# Patient Record
Sex: Male | Born: 1970 | Race: White | Hispanic: No | Marital: Married | Smoking: Never smoker
Health system: Southern US, Community
[De-identification: ages and names within clinical notes are randomized; demographics above are authoritative.]

## PROBLEM LIST (undated history)

## (undated) DIAGNOSIS — M549 Dorsalgia, unspecified: Secondary | ICD-10-CM

## (undated) DIAGNOSIS — N189 Chronic kidney disease, unspecified: Secondary | ICD-10-CM

## (undated) DIAGNOSIS — G500 Trigeminal neuralgia: Secondary | ICD-10-CM

## (undated) DIAGNOSIS — N200 Calculus of kidney: Secondary | ICD-10-CM

## (undated) HISTORY — DX: Dorsalgia, unspecified: M54.9

## (undated) HISTORY — DX: Chronic kidney disease, unspecified: N18.9

## (undated) HISTORY — PX: PERCUTANEOUS NEPHROSTOMY: SHX2208

## (undated) HISTORY — PX: OTHER SURGICAL HISTORY: SHX169

---

## 2003-02-16 HISTORY — PX: WISDOM TOOTH EXTRACTION: SHX21

## 2009-09-17 ENCOUNTER — Emergency Department (HOSPITAL_COMMUNITY): Admission: EM | Admit: 2009-09-17 | Discharge: 2009-09-17 | Payer: Self-pay | Admitting: Family Medicine

## 2009-09-17 ENCOUNTER — Emergency Department (HOSPITAL_COMMUNITY): Admission: EM | Admit: 2009-09-17 | Discharge: 2009-09-18 | Payer: Self-pay | Admitting: Emergency Medicine

## 2009-09-25 ENCOUNTER — Ambulatory Visit (HOSPITAL_COMMUNITY): Admission: RE | Admit: 2009-09-25 | Discharge: 2009-09-25 | Payer: Self-pay | Admitting: Urology

## 2009-10-06 ENCOUNTER — Ambulatory Visit (HOSPITAL_COMMUNITY): Admission: RE | Admit: 2009-10-06 | Discharge: 2009-10-06 | Payer: Self-pay | Admitting: Urology

## 2009-10-10 ENCOUNTER — Emergency Department (HOSPITAL_COMMUNITY): Admission: EM | Admit: 2009-10-10 | Discharge: 2009-10-10 | Payer: Self-pay | Admitting: Emergency Medicine

## 2009-10-12 ENCOUNTER — Emergency Department (HOSPITAL_COMMUNITY): Admission: EM | Admit: 2009-10-12 | Discharge: 2009-10-12 | Payer: Self-pay | Admitting: Family Medicine

## 2010-01-28 ENCOUNTER — Inpatient Hospital Stay (HOSPITAL_COMMUNITY)
Admission: RE | Admit: 2010-01-28 | Discharge: 2010-01-30 | Payer: Self-pay | Source: Home / Self Care | Attending: Urology | Admitting: Urology

## 2010-01-28 ENCOUNTER — Encounter: Payer: Self-pay | Admitting: Urology

## 2010-04-27 LAB — CBC
HCT: 38.3 % — ABNORMAL LOW (ref 39.0–52.0)
Hemoglobin: 13.2 g/dL (ref 13.0–17.0)
MCH: 29.7 pg (ref 26.0–34.0)
MCHC: 34.5 g/dL (ref 30.0–36.0)
RDW: 12.8 % (ref 11.5–15.5)

## 2010-04-28 LAB — CBC
HCT: 42 % (ref 39.0–52.0)
Hemoglobin: 14.7 g/dL (ref 13.0–17.0)
MCH: 29.7 pg (ref 26.0–34.0)
MCHC: 35 g/dL (ref 30.0–36.0)
RDW: 13 % (ref 11.5–15.5)

## 2010-04-28 LAB — SURGICAL PCR SCREEN
MRSA, PCR: NEGATIVE
Staphylococcus aureus: NEGATIVE

## 2010-05-01 LAB — DIFFERENTIAL
Basophils Absolute: 0 10*3/uL (ref 0.0–0.1)
Basophils Relative: 0 % (ref 0–1)
Eosinophils Absolute: 0 10*3/uL (ref 0.0–0.7)
Eosinophils Relative: 0 % (ref 0–5)
Lymphocytes Relative: 5 % — ABNORMAL LOW (ref 12–46)
Monocytes Absolute: 0.8 10*3/uL (ref 0.1–1.0)
Monocytes Absolute: 0.9 10*3/uL (ref 0.1–1.0)
Monocytes Relative: 6 % (ref 3–12)
Monocytes Relative: 6 % (ref 3–12)
Neutro Abs: 12.4 10*3/uL — ABNORMAL HIGH (ref 1.7–7.7)
Neutrophils Relative %: 89 % — ABNORMAL HIGH (ref 43–77)

## 2010-05-01 LAB — URINALYSIS, ROUTINE W REFLEX MICROSCOPIC
Glucose, UA: NEGATIVE mg/dL
Protein, ur: 100 mg/dL — AB
Urobilinogen, UA: 0.2 mg/dL (ref 0.0–1.0)

## 2010-05-01 LAB — CBC
HCT: 43.9 % (ref 39.0–52.0)
HCT: 44.1 % (ref 39.0–52.0)
Hemoglobin: 15.3 g/dL (ref 13.0–17.0)
Hemoglobin: 15.3 g/dL (ref 13.0–17.0)
MCH: 29.9 pg (ref 26.0–34.0)
MCHC: 34.7 g/dL (ref 30.0–36.0)
MCV: 86.9 fL (ref 78.0–100.0)
RDW: 13.3 % (ref 11.5–15.5)
WBC: 13.9 10*3/uL — ABNORMAL HIGH (ref 4.0–10.5)

## 2010-05-01 LAB — URINALYSIS, MICROSCOPIC ONLY
Glucose, UA: NEGATIVE mg/dL
Protein, ur: NEGATIVE mg/dL
Specific Gravity, Urine: 1.021 (ref 1.005–1.030)

## 2010-05-01 LAB — POCT URINALYSIS DIPSTICK
Bilirubin Urine: NEGATIVE
Glucose, UA: NEGATIVE mg/dL
Ketones, ur: 15 mg/dL — AB
Nitrite: NEGATIVE
Protein, ur: NEGATIVE mg/dL
Specific Gravity, Urine: 1.02 (ref 1.005–1.030)
Urobilinogen, UA: 0.2 mg/dL (ref 0.0–1.0)
pH: 6 (ref 5.0–8.0)
pH: 5 (ref 5.0–8.0)

## 2010-05-01 LAB — URINE CULTURE
Colony Count: NO GROWTH
Colony Count: NO GROWTH
Culture  Setup Time: 201108281953
Culture: NO GROWTH

## 2010-05-01 LAB — BASIC METABOLIC PANEL
BUN: 15 mg/dL (ref 6–23)
Chloride: 102 mEq/L (ref 96–112)
Glucose, Bld: 118 mg/dL — ABNORMAL HIGH (ref 70–99)
Potassium: 3.6 mEq/L (ref 3.5–5.1)
Sodium: 134 mEq/L — ABNORMAL LOW (ref 135–145)

## 2010-05-01 LAB — POCT I-STAT, CHEM 8
Calcium, Ion: 1.09 mmol/L — ABNORMAL LOW (ref 1.12–1.32)
Creatinine, Ser: 1 mg/dL (ref 0.4–1.5)
Glucose, Bld: 94 mg/dL (ref 70–99)
Hemoglobin: 16.3 g/dL (ref 13.0–17.0)
TCO2: 24 mmol/L (ref 0–100)

## 2010-05-01 LAB — HEMOCCULT GUIAC POC 1CARD (OFFICE): Fecal Occult Bld: NEGATIVE

## 2012-04-01 ENCOUNTER — Other Ambulatory Visit: Payer: Self-pay

## 2012-12-21 ENCOUNTER — Other Ambulatory Visit: Payer: Self-pay

## 2014-01-18 ENCOUNTER — Other Ambulatory Visit (HOSPITAL_COMMUNITY): Payer: Self-pay | Admitting: Family Medicine

## 2014-01-18 DIAGNOSIS — R590 Localized enlarged lymph nodes: Secondary | ICD-10-CM

## 2014-01-23 ENCOUNTER — Ambulatory Visit (HOSPITAL_COMMUNITY)
Admission: RE | Admit: 2014-01-23 | Discharge: 2014-01-23 | Disposition: A | Discharge status: Home / Self Care | Payer: 59 | Source: Ambulatory Visit | Attending: Family Medicine | Admitting: Family Medicine

## 2014-01-23 DIAGNOSIS — R590 Localized enlarged lymph nodes: Secondary | ICD-10-CM | POA: Diagnosis present

## 2014-05-30 ENCOUNTER — Other Ambulatory Visit: Payer: Self-pay | Admitting: Gastroenterology

## 2014-07-14 ENCOUNTER — Emergency Department (HOSPITAL_COMMUNITY)
Admission: EM | Admit: 2014-07-14 | Discharge: 2014-07-14 | Disposition: A | Discharge status: Home / Self Care | Payer: 59 | Attending: Emergency Medicine | Admitting: Emergency Medicine

## 2014-07-14 ENCOUNTER — Encounter (HOSPITAL_COMMUNITY): Payer: Self-pay

## 2014-07-14 ENCOUNTER — Emergency Department (HOSPITAL_COMMUNITY): Payer: 59

## 2014-07-14 DIAGNOSIS — N200 Calculus of kidney: Secondary | ICD-10-CM | POA: Insufficient documentation

## 2014-07-14 DIAGNOSIS — N189 Chronic kidney disease, unspecified: Secondary | ICD-10-CM | POA: Insufficient documentation

## 2014-07-14 DIAGNOSIS — R109 Unspecified abdominal pain: Secondary | ICD-10-CM | POA: Diagnosis present

## 2014-07-14 LAB — I-STAT CHEM 8, ED
BUN: 19 mg/dL (ref 6–20)
CHLORIDE: 107 mmol/L (ref 101–111)
Calcium, Ion: 1.12 mmol/L (ref 1.12–1.23)
Creatinine, Ser: 1.1 mg/dL (ref 0.61–1.24)
Glucose, Bld: 126 mg/dL — ABNORMAL HIGH (ref 65–99)
HCT: 44 % (ref 39.0–52.0)
HEMOGLOBIN: 15 g/dL (ref 13.0–17.0)
POTASSIUM: 4 mmol/L (ref 3.5–5.1)
Sodium: 142 mmol/L (ref 135–145)
TCO2: 20 mmol/L (ref 0–100)

## 2014-07-14 LAB — URINE MICROSCOPIC-ADD ON

## 2014-07-14 LAB — URINALYSIS, ROUTINE W REFLEX MICROSCOPIC
Bilirubin Urine: NEGATIVE
Glucose, UA: NEGATIVE mg/dL
KETONES UR: 15 mg/dL — AB
Leukocytes, UA: NEGATIVE
Nitrite: NEGATIVE
PH: 6.5 (ref 5.0–8.0)
Protein, ur: NEGATIVE mg/dL
SPECIFIC GRAVITY, URINE: 1.024 (ref 1.005–1.030)
UROBILINOGEN UA: 0.2 mg/dL (ref 0.0–1.0)

## 2014-07-14 LAB — CBC
HEMATOCRIT: 40.5 % (ref 39.0–52.0)
Hemoglobin: 13.9 g/dL (ref 13.0–17.0)
MCH: 28.8 pg (ref 26.0–34.0)
MCHC: 34.3 g/dL (ref 30.0–36.0)
MCV: 84 fL (ref 78.0–100.0)
Platelets: 285 10*3/uL (ref 150–400)
RBC: 4.82 MIL/uL (ref 4.22–5.81)
RDW: 12.9 % (ref 11.5–15.5)
WBC: 9 10*3/uL (ref 4.0–10.5)

## 2014-07-14 MED ORDER — ONDANSETRON HCL 4 MG/2ML IJ SOLN
4.0000 mg | Freq: Once | INTRAMUSCULAR | Status: AC
  Administered 2014-07-14: 4 mg via INTRAVENOUS
  Filled 2014-07-14: qty 2

## 2014-07-14 MED ORDER — CIPROFLOXACIN IN D5W 400 MG/200ML IV SOLN
INTRAVENOUS | Status: AC
  Filled 2014-07-14: qty 200

## 2014-07-14 MED ORDER — OXYCODONE-ACETAMINOPHEN 5-325 MG PO TABS: 1.0000 | ORAL_TABLET | Freq: Four times a day (QID) | ORAL | Status: AC | PRN

## 2014-07-14 MED ORDER — CIPROFLOXACIN IN D5W 400 MG/200ML IV SOLN: 400.0000 mg | INTRAVENOUS | Status: DC

## 2014-07-14 MED ORDER — HYDROMORPHONE HCL 1 MG/ML IJ SOLN
1.0000 mg | Freq: Once | INTRAMUSCULAR | Status: AC
  Administered 2014-07-14: 1 mg via INTRAVENOUS
  Filled 2014-07-14: qty 1

## 2014-07-14 MED ORDER — OXYCODONE-ACETAMINOPHEN 5-325 MG PO TABS
2.0000 | ORAL_TABLET | Freq: Once | ORAL | Status: AC
  Administered 2014-07-14: 2 via ORAL
  Filled 2014-07-14: qty 2

## 2014-07-14 MED ORDER — MIDAZOLAM HCL 2 MG/2ML IJ SOLN
INTRAMUSCULAR | Status: AC
  Filled 2014-07-14: qty 6

## 2014-07-14 MED ORDER — FENTANYL CITRATE (PF) 100 MCG/2ML IJ SOLN
INTRAMUSCULAR | Status: AC
  Filled 2014-07-14: qty 6

## 2014-07-14 MED ORDER — ONDANSETRON HCL 4 MG PO TABS: 4.0000 mg | ORAL_TABLET | Freq: Four times a day (QID) | ORAL | Status: AC

## 2014-07-14 NOTE — ED Notes (Signed)
Patient states he has known kidney stones and was awakened this AM with left flank pain. Patient denies any obvious blood in the urine.

## 2014-07-14 NOTE — ED Notes (Signed)
Dr. Linker at bedside  

## 2014-07-14 NOTE — ED Provider Notes (Signed)
CSN: [id number]     Arrival date & time 07/14/14  0902 History   First MD Initiated Contact with Patient 07/14/14 [phone]     Chief Complaint  Patient presents with  . Flank Pain     (Consider location/radiation/quality/duration/timing/severity/associated sxs/prior Treatment) HPI  Pt with hx of renal stones, percutaneous nephrostomy in 2011 presents with left flank pain. Pain began at 6am this morning.  Pain is constant, waxing and waning.  Radiating to his left abdomen.  Feels similar to prior kidney stones.  Pt states that he saw urology last week- found 2 large stones in kidney that are too large to pass- planning for scheduled nephrostomy- he is not yet on the schedule for this- then began having pain this morning.  No fever/chills.  Has had some vomiting when pain is most intense.  Has been able to urinate normally, no blood in urine.  There are no other associated systemic symptoms, there are no other alleviating or modifying factors.   Past Medical History  Diagnosis Date  . Back pain   . Chronic kidney disease    Past Surgical History  Procedure Laterality Date  . Lithrotripsy    . Percutaneous nephrostomy     Family History  Problem Relation Age of Onset  . Cancer Mother     colon   . Kidney Stones Father   . Cancer Father     renal cell   . Cancer Sister     unknown   . Nephrolithiasis Brother   . Diabetes Maternal Grandmother    History  Substance Use Topics  . Smoking status: Never Smoker   . Smokeless tobacco: Never Used  . Alcohol Use: No     Comment: rare     Review of Systems  ROS reviewed and all otherwise negative except for mentioned in HPI    Allergies  Review of patient's allergies indicates no known allergies.  Home Medications   Prior to Admission medications   Medication Sig Start Date End Date Taking? Authorizing Provider  ibuprofen (ADVIL,MOTRIN) 200 MG tablet Take 400 mg by mouth every 6 (six) hours as needed for moderate pain.   Yes  Historical Provider, MD  docusate sodium (COLACE) 100 MG capsule Take 100 mg by mouth daily as needed for mild constipation.    Historical Provider, MD  gabapentin (NEURONTIN) 300 MG capsule Take 1 capsule (300 mg total) by mouth 3 (three) times daily. 07/16/14   Drema Dallas, DO  ondansetron (ZOFRAN) 4 MG tablet Take 1 tablet (4 mg total) by mouth every 6 (six) hours. 07/14/14   Jerelyn Scott, MD  oxyCODONE-acetaminophen (PERCOCET/ROXICET) 5-325 MG per tablet Take 1-2 tablets by mouth every 6 (six) hours as needed for severe pain. 07/14/14   Jerelyn Scott, MD   BP 132/67 mmHg  Pulse 55  Temp(Src) 97.5 F (36.4 C) (Oral)  Resp 16  Ht  (1.778 m)  Wt 217 lb (98.431 kg)  BMI 31.14 kg/m2  SpO2 96%  Vitals reviewed Physical Exam  Physical Examination: General appearance - alert, well appearing, and in no distress Mental status - alert, oriented to person, place, and time Eyes - no conjunctival injection no scleral icterus Mouth - mucous membranes moist, pharynx normal without lesions Chest - clear to auscultation, no wheezes, rales or rhonchi, symmetric air entry Heart - normal rate, regular rhythm, normal S1, S2, no murmurs, rubs, clicks or gallops Abdomen - soft, nontender, nondistended, no masses or organomegaly Back exam - left CVA  tenderness Extremities - peripheral pulses normal, no pedal edema, no clubbing or cyanosis Skin - normal coloration and turgor, no rashes  ED Course  Procedures (including critical care time)  10:25 AM d/w Dr. Annabell HowellsWrenn, urology.  He requests renal ultrasound to evaluate for obstruction- CT reviewed from 5/11 and there was no obstruction at that time.  If no obstruction and pain managed, pt should f/u with Dr. Retta Dionesahlstedt on an outpatient basis.   Labs Review Labs Reviewed  URINALYSIS, ROUTINE W REFLEX MICROSCOPIC (NOT [facility]) - Abnormal; Notable for the following:    APPearance CLOUDY (*)    Hgb urine dipstick LARGE (*)    Ketones, ur 15 (*)    All  other components within normal limits  I-STAT CHEM 8, ED - Abnormal; Notable for the following:    Glucose, Bld 126 (*)    All other components within normal limits  URINE MICROSCOPIC-ADD ON  CBC    Imaging Review No results found.   EKG Interpretation None      MDM   Final diagnoses:  Renal stone     10:50 AM pt continues to have pain, going for ultrasound, given another dose of dilaudid.    12:25 PM pt states his pain is at a 4/10- he states this is tolerable, will give him po meds in the ED to be sure he can tolerate.  D/w patient the conversation I had with Dr. Annabell HowellsWrenn and plan for outpatient followup if his pain is under control.   1:26 PM pt continues to have pain, back up to a 9/10- d/w Dr. Annabell HowellsWrenn, he suggests patient have the  nephrostomy tube placed by IR.  D/w Dr. Deanne CofferHassell, IR, he will d/w Dr. Annabell HowellsWrenn and they will decide on the plan.    2:47 PM IR tech has come to get the patient, she states IR physician is on his way to the hospital.    3:28 PM d/w IR, Dr. Fredia Sorrowyamagata - he has seen patient and talked with him- pt is feeling better at this time.  IR physician also spoke to Dr. Retta Dionesahlstedt who does not recommend percutaneous nephrostomy- he will set him up later this week for lithotripsy and stent.  Will discharge home with pain medications.    Jerelyn ScottMartha Linker, MD 07/16/14 [phone]

## 2014-07-14 NOTE — ED Notes (Signed)
Pt taken to IR

## 2014-07-14 NOTE — ED Notes (Signed)
Pt provided with urinal, sts that he is unable to provide urine at this time

## 2014-07-14 NOTE — Discharge Instructions (Signed)
Return to the ED with any concerns including vomiting and not able to keep down liquids, pain not controlled by pain medications, fever/chills, decreased level of alertness/lethargy, or any other alarming symptoms  You should not take NSAIDS or aspirin

## 2014-07-14 NOTE — Progress Notes (Signed)
Chief Complaint: Chief Complaint  Patient presents with  . Flank Pain    History of Present Illness: Mitchell Dougherty is a [age] y.o. male with a known partially obstructing 13 mm left renal pelvic calculus and additional lower pole collecting system calculi presenting with acute exacerbation of left flank pain. The patient is seen by Dr. Retta Dionesahlstedt, who was planning elective lithotripsy versus percutaneous nephrolithotomy electively.  Dr. Annabell HowellsWrenn consulted today, who spoke with Dr. Deanne CofferHassell of IR about performing emergent percutaneous nephrostomy today.  Renal US today shows mild dilatation of left renal collecting system without significant hydronephrosis.  Past Medical History  Diagnosis Date  . Kidney stone     Past Surgical History  Procedure Laterality Date  . Lithrotripsy    . Percutaneous nephrostomy      Allergies: Review of patient's allergies indicates no known allergies.  Medications: Prior to Admission medications   Medication Sig Start Date End Date Taking? Authorizing Provider  ibuprofen (ADVIL,MOTRIN) 200 MG tablet Take 400 mg by mouth every 6 (six) hours as needed for moderate pain.   Yes Historical Provider, MD    Family History  Problem Relation Age of Onset  . Cancer Mother   . Kidney Stones Father   . Cancer Father     History   Social History  . Marital Status: Married    Spouse Name: N/A  . Number of Children: N/A  . Years of Education: N/A   Social History Main Topics  . Smoking status: Never Smoker   . Smokeless tobacco: Never Used  . Alcohol Use: No  . Drug Use: No  . Sexual Activity: Not on file   Other Topics Concern  . None   Social History Narrative  . None     Review of Systems  Vital Signs: BP 115/70 mmHg  Pulse 64  Temp(Src) 98.4 F (36.9 C) (Oral)  Resp 16  Ht 5\' 10"  (1.778 m)  Wt 217 lb (98.431 kg)  BMI 31.14 kg/m2  SpO2 96%  Physical Exam  Imaging: Dg Abd 1 View  07/14/2014   CLINICAL DATA:  Left side flank  pain started this morning; hx left percutaneous nephrostomy couple years ago; pt states that he was at urologist office 2 weeks ago for routine check up with no pain, and was told that he had 2 left side kidney stones; pt states that his urologist is planning doing another perc,  EXAM: ABDOMEN - 1 VIEW  COMPARISON:  CT 06/26/2014  FINDINGS: 13 mm calculus in the region of the left UPJ. 17 mm cluster of calculi in the lower pole left renal collecting system. Normal bowel gas pattern. Bilateral pelvic phleboliths.  Regional bones unremarkable.  IMPRESSION: 1. Stable left nephrolithiasis and UPJ calculus. 2. Normal bowel gas pattern.   Electronically Signed   By: Corlis Leak  Hassell M.D.   On: 07/14/2014 10:53   [place] Renal  07/14/2014   CLINICAL DATA:  Left flank pain  EXAM: RENAL / URINARY TRACT ULTRASOUND COMPLETE  COMPARISON:  CT abdomen pelvis dated 06/26/2014  FINDINGS: Right Kidney:  Length: 12.3 cm. 3 mm nonobstructing upper pole renal calculus. No hydronephrosis.  Left Kidney:  Length: 11.9 cm. 3.8 x 3.9 x 3.6 cm upper pole renal cyst. 5 mm nonobstructing interpolar calculus. 8 mm nonobstructing lower pole renal calculus. Mild fullness of the renal pelvis without frank hydronephrosis.  Bladder:  Within normal limits.  Bilateral bladder jets are visualized.  IMPRESSION: 3.9 cm left upper pole renal cyst.  Bilateral nonobstructing renal calculi measuring up to 8 mm on the left.  Mild fullness of the left renal pelvis without frank hydronephrosis.   Electronically Signed   By: Charline Bills M.D.   On: 07/14/2014 11:49    Labs:  CBC:  Recent Labs  07/14/14 0944 07/14/14 1336  WBC  --  9.0  HGB 15.0 13.9  HCT 44.0 40.5  PLT  --  285    COAGS: No results for input(s): INR, APTT in the last 8760 hours.  BMP:  Recent Labs  07/14/14 0944  NA 142  K 4.0  CL 107  GLUCOSE 126*  BUN 19  CREATININE 1.10    Assessment and Plan:  When patient brought to IR for left PCN, his pain had diminished  significantly back to a chronic 3-4/10 grade level.  I had a long discussion with the patient and his wife about pros and cons of performing nephrostomy today.  I talked to Dr. Retta Diones over the phone and we agreed to hold on PCN today given improved symptoms and lack of evidence of sepsis/infection.  Given US findings, PCN may also be challenging in this setting.  Dr. Retta Diones planning possible lithotripsy later this week.  The patient is in agreement and will follow up with Dr. Retta Diones.  I have discussed this plan with Dr. Karma Ganja in the Emergency Department.   Jodi Marble. Fredia Sorrow, M.D Pager:  [phone]

## 2014-07-16 ENCOUNTER — Encounter: Payer: Self-pay | Admitting: Neurology

## 2014-07-16 ENCOUNTER — Ambulatory Visit (INDEPENDENT_AMBULATORY_CARE_PROVIDER_SITE_OTHER): Payer: 59 | Admitting: Neurology

## 2014-07-16 ENCOUNTER — Other Ambulatory Visit: Payer: Self-pay | Admitting: Urology

## 2014-07-16 VITALS — BP 120/68 | HR 68 | Resp 67 | Ht 70.0 in | Wt 219.4 lb

## 2014-07-16 DIAGNOSIS — G500 Trigeminal neuralgia: Secondary | ICD-10-CM | POA: Diagnosis not present

## 2014-07-16 MED ORDER — GABAPENTIN 300 MG PO CAPS: 300.0000 mg | ORAL_CAPSULE | Freq: Three times a day (TID) | ORAL | Status: AC

## 2014-07-16 NOTE — Progress Notes (Signed)
NEUROLOGY CONSULTATION NOTE  Mitchell Dougherty MRN: [id number] DOB: August 31, 1970  Referring provider: Roland Rack, PA-C Primary care provider: Johny Blamer, MD  Reason for consult:  Trigeminal neuralgia  HISTORY OF PRESENT ILLNESS: Mitchell Dougherty is a 44 year old right-handed man who presents for trigeminal neuralgia.  Records from PCP and ultrasound report reviewed.  In 2005, she had wisdom teeth extracted.  Around this time, he began to experience episodic facial pain.  He describes that the gums on the right side of his jaw, where the wisdom tooth was pulled, becomes sore and tender.  It then causes tenderness to touch of the entire right side of his face, as well as a sensation of sunburn.  He then develops 5-10 seconds of shooting pain from the right posterior jaw up the side of face, behind the ear and to the head.  It occurs consistently for 2 or 3 days.  Each episode occurs 2 or 3 times a year.  He usually takes ibuprofen or tylenol.  He denies facial weakness or numbness.  Of note, he had a soft tissue ultrasound of the head and neck in December to evaluate cervical lymphadenopathy, which was unremarkable.  PAST MEDICAL HISTORY: Past Medical History  Diagnosis Date  . Back pain   . Chronic kidney disease     PAST SURGICAL HISTORY: Past Surgical History  Procedure Laterality Date  . Lithrotripsy    . Percutaneous nephrostomy      MEDICATIONS: Current Outpatient Prescriptions on File Prior to Visit  Medication Sig Dispense Refill  . ibuprofen (ADVIL,MOTRIN) 200 MG tablet Take 400 mg by mouth every 6 (six) hours as needed for moderate pain.    Marland Kitchen ondansetron (ZOFRAN) 4 MG tablet Take 1 tablet (4 mg total) by mouth every 6 (six) hours. 12 tablet 0  . oxyCODONE-acetaminophen (PERCOCET/ROXICET) 5-325 MG per tablet Take 1-2 tablets by mouth every 6 (six) hours as needed for severe pain. 20 tablet 0   No current facility-administered medications on file prior to visit.      ALLERGIES: No Known Allergies  FAMILY HISTORY: Family History  Problem Relation Age of Onset  . Cancer Mother     colon   . Kidney Stones Father   . Cancer Father     renal cell   . Cancer Sister     unknown   . Nephrolithiasis Brother   . Diabetes Maternal Grandmother     SOCIAL HISTORY: History   Social History  . Marital Status: Married    Spouse Name: N/A  . Number of Children: N/A  . Years of Education: N/A   Occupational History  . Not on file.   Social History Main Topics  . Smoking status: Never Smoker   . Smokeless tobacco: Never Used  . Alcohol Use: No     Comment: rare   . Drug Use: No  . Sexual Activity:    Partners: Female   Other Topics Concern  . Not on file   Social History Narrative    REVIEW OF SYSTEMS: Constitutional: No fevers, chills, or sweats, no generalized fatigue, change in appetite Eyes: No visual changes, double vision, eye pain Ear, nose and throat: No hearing loss, ear pain, nasal congestion, sore throat Cardiovascular: No chest pain, palpitations Respiratory:  No shortness of breath at rest or with exertion, wheezes GastrointestinaI: No nausea, vomiting, diarrhea, abdominal pain, fecal incontinence Genitourinary:  No dysuria, urinary retention or frequency Musculoskeletal:  No neck pain, back pain Integumentary: No rash,  pruritus, skin lesions Neurological: as above Psychiatric: No depression, insomnia, anxiety Endocrine: No palpitations, fatigue, diaphoresis, mood swings, change in appetite, change in weight, increased thirst Hematologic/Lymphatic:  No anemia, purpura, petechiae. Allergic/Immunologic: no itchy/runny eyes, nasal congestion, recent allergic reactions, rashes  PHYSICAL EXAM: Filed Vitals:   07/16/14 1005  BP: 120/68  Pulse: 68  Resp: 67   General: No acute distress Head:  Normocephalic/atraumatic Eyes:  fundi unremarkable, without vessel changes, exudates, hemorrhages or papilledema. Neck:  supple, no paraspinal tenderness, full range of motion Back: No paraspinal tenderness Heart: regular rate and rhythm Lungs: Clear to auscultation bilaterally. Vascular: No carotid bruits. Neurological Exam: Mental status: alert and oriented to person, place, and time, recent and remote memory intact, fund of knowledge intact, attention and concentration intact, speech fluent and not dysarthric, language intact. Cranial nerves: CN I: not tested CN II: pupils equal, round and reactive to light, visual fields intact, fundi unremarkable, without vessel changes, exudates, hemorrhages or papilledema. CN III, IV, VI:  full range of motion, no nystagmus, no ptosis CN V: facial sensation intact CN VII: upper and lower face symmetric CN VIII: hearing intact CN IX, X: gag intact, uvula midline CN XI: sternocleidomastoid and trapezius muscles intact CN XII: tongue midline Bulk & Tone: normal, no fasciculations. Motor:  5/5 throughout Sensation:  Pinprick and vibration intact Deep Tendon Reflexes:  2+ throughout, toes downgoing Finger to nose testing:  No dysmetria Heel to shin:  No dysmetria Gait:  Normal station and stride.  Able to turn and walk in tandem. Romberg negative.  IMPRESSION: Right sided trigeminal neuralgia, possibly triggered by nerve damage from prior tooth extraction  PLAN: Since it occurs so infrequently, will not start a daily preventative. Instead, will prescribe gabapentin 300mg  to take as needed when he has a flareup Alternatively, we can try prednisone taper Follow up in 6 months or as needed.  Thank you for allowing me to take part in the care of this patient.  Shon MilletAdam Delanee Xin, DO  CC:  Johny BlamerWilliam Harris, MD  Roland RackLorraine Fuqua, PA-C

## 2014-07-16 NOTE — Patient Instructions (Signed)
Since it occurs infrequently, I will prescribe you gabapentin 300mg  to take up to 3 times daily when you have a flareup.  If ineffective, we can also try a prednisone taper as well.  Follow up in 6 months or as needed.

## 2014-07-17 ENCOUNTER — Encounter (HOSPITAL_COMMUNITY): Payer: Self-pay | Admitting: *Deleted

## 2014-07-17 NOTE — H&P (Signed)
  Urology History and Physical Exam  CC: Left ureteral stone  HPI: 44 year old male presents for ESL of a left proximal ureteral stone. He has a known hx of stone disease. S/P left PCNL 2011--had not been seen since late 2011. Recently presented w/ history of gross hematuria. Eval revelaed 1ve 3 by 8 mm left prox ureteral stone as well as an aggregate LLP stone collection of 15 mm. Ureteral stone--SSD 11 cm, 750 HU. We have discussed management -- repeat PCNL vs immediate treatment of his ureteral stone and possible later management of LP stones. PCNL will be delayed due to lack of OR time. We have made the decision to proceed w/ PCNL.  PMH: Past Medical History  Diagnosis Date  . Back pain   . Chronic kidney disease     PSH: Past Surgical History  Procedure Laterality Date  . Lithrotripsy    . Percutaneous nephrostomy    . Wisdom tooth extraction  2005    Allergies: No Known Allergies  Medications: No prescriptions prior to admission     Social History: History   Social History  . Marital Status: Married    Spouse Name: N/A  . Number of Children: N/A  . Years of Education: N/A   Occupational History  . Not on file.   Social History Main Topics  . Smoking status: Never Smoker   . Smokeless tobacco: Never Used  . Alcohol Use: No     Comment: rare   . Drug Use: No  . Sexual Activity:    Partners: Female   Other Topics Concern  . Not on file   Social History Narrative    Family History: Family History  Problem Relation Age of Onset  . Cancer Mother     colon   . Kidney Stones Father   . Cancer Father     renal cell   . Cancer Sister     unknown   . Nephrolithiasis Brother   . Diabetes Maternal Grandmother     Review of Systems: As discussed in HPI                Physical Exam: @VITALS2 @ General: No acute distress.  Awake. Head:  Normocephalic.  Atraumatic. ENT:  EOMI.  Mucous membranes moist Neck:  Supple.  . CV:   Regular  rate. Pulmonary: Equal effort bilaterally.   Abdomen: Soft.  Non tender to palpation. Skin:  Normal turgor.  No visible rash. Extremity: No gross deformity of bilateral upper extremities.  No gross deformity of                             lower extremities. Neurologic: Alert. Appropriate mood.   Studies:  No results for input(s): HGB, WBC, PLT in the last 72 hours.  No results for input(s): NA, K, CL, CO2, BUN, CREATININE, CALCIUM, GFRNONAA, GFRAA in the last 72 hours.  Invalid input(s): MAGNESIUM   No results for input(s): INR, APTT in the last 72 hours.  Invalid input(s): PT   Invalid input(s): ABG    Assessment:  8x13 mm left upper ureteral stone. SSD 11 cm  750 HU  Plan: Left ESL

## 2014-07-18 ENCOUNTER — Encounter (HOSPITAL_COMMUNITY)
Admission: RE | Disposition: A | Discharge status: Home / Self Care | Payer: Self-pay | Source: Ambulatory Visit | Attending: Urology

## 2014-07-18 ENCOUNTER — Encounter (HOSPITAL_COMMUNITY): Payer: Self-pay | Admitting: *Deleted

## 2014-07-18 ENCOUNTER — Ambulatory Visit (HOSPITAL_COMMUNITY)
Admission: RE | Admit: 2014-07-18 | Discharge: 2014-07-18 | Disposition: A | Discharge status: Home / Self Care | Payer: 59 | Source: Ambulatory Visit | Attending: Urology | Admitting: Urology

## 2014-07-18 ENCOUNTER — Ambulatory Visit (HOSPITAL_COMMUNITY): Payer: 59

## 2014-07-18 DIAGNOSIS — Z87442 Personal history of urinary calculi: Secondary | ICD-10-CM | POA: Insufficient documentation

## 2014-07-18 DIAGNOSIS — N200 Calculus of kidney: Secondary | ICD-10-CM | POA: Insufficient documentation

## 2014-07-18 DIAGNOSIS — N189 Chronic kidney disease, unspecified: Secondary | ICD-10-CM | POA: Diagnosis not present

## 2014-07-18 DIAGNOSIS — N201 Calculus of ureter: Secondary | ICD-10-CM | POA: Diagnosis present

## 2014-07-18 SURGERY — LITHOTRIPSY, ESWL
Anesthesia: LOCAL | Laterality: Left

## 2014-07-18 MED ORDER — DIPHENHYDRAMINE HCL 25 MG PO CAPS
25.0000 mg | ORAL_CAPSULE | ORAL | Status: AC
  Administered 2014-07-18: 25 mg via ORAL
  Filled 2014-07-18: qty 1

## 2014-07-18 MED ORDER — SODIUM CHLORIDE 0.9 % IV SOLN
INTRAVENOUS | Status: DC
  Administered 2014-07-18: 08:00:00 via INTRAVENOUS

## 2014-07-18 MED ORDER — DIAZEPAM 5 MG PO TABS
10.0000 mg | ORAL_TABLET | ORAL | Status: AC
  Administered 2014-07-18: 10 mg via ORAL
  Filled 2014-07-18: qty 2

## 2014-07-18 MED ORDER — CIPROFLOXACIN HCL 500 MG PO TABS
500.0000 mg | ORAL_TABLET | ORAL | Status: AC
  Administered 2014-07-18: 500 mg via ORAL
  Filled 2014-07-18: qty 1

## 2014-07-18 NOTE — Op Note (Signed)
See [facility] OP note scanned into chart. 

## 2014-07-18 NOTE — Interval H&P Note (Signed)
History and Physical Interval Note:  07/18/2014 8:48 AM  Mitchell EconomyJesse G Oki  has presented today for surgery, with the diagnosis of LEFT URETEROPELVIC JUNCTION STONE  The various methods of treatment have been discussed with the patient and family. After consideration of risks, benefits and other options for treatment, the patient has consented to  Procedure(s): LEFT EXTRACORPOREAL SHOCK WAVE LITHOTRIPSY (ESWL) (Left) as a surgical intervention .  The patient's history has been reviewed, patient examined, no change in status, stable for surgery.  I have reviewed the patient's chart and labs.  Questions were answered to the patient's satisfaction.     Chelsea AusDAHLSTEDT, Tijana Walder M

## 2014-07-18 NOTE — Discharge Instructions (Signed)
See [facility] discharge instructions in chart.  

## 2014-09-05 ENCOUNTER — Other Ambulatory Visit: Payer: Self-pay | Admitting: Urology

## 2014-09-05 DIAGNOSIS — N200 Calculus of kidney: Secondary | ICD-10-CM

## 2014-11-01 NOTE — Patient Instructions (Addendum)
Mitchell Dougherty  11/01/2014   Your procedure is scheduled on: Thursday 11/07/2014  Report to [facility] Main  Entrance and go to Radiology to check in at 0730 am.  Call this number if you have problems the morning of surgery [phone]   Remember: ONLY 1 PERSON MAY GO WITH YOU TO SHORT STAY TO GET  READY MORNING OF YOUR SURGERY.  Do not eat food or drink liquids :After Midnight.     Take these medicines the morning of surgery with A SIP OF WATER: none                               You may not have any metal on your body including hair pins and              piercings  Do not wear jewelry, make-up, lotions, powders or perfumes, deodorant             Do not wear nail polish.  Do not shave  48 hours prior to surgery.              Men may shave face and neck.   Do not bring valuables to the hospital. [place] IS NOT             RESPONSIBLE   FOR VALUABLES.  Contacts, dentures or bridgework may not be worn into surgery.  Leave suitcase in the car. After surgery it may be brought to your room.     Patients discharged the day of surgery will not be allowed to drive home.  Name and phone number of your driver:  Special Instructions: N/A              Please read over the following fact sheets you were given: _____________________________________________________________________             [facility] - Preparing for Surgery Before surgery, you can play an important role.  Because skin is not sterile, your skin needs to be as free of germs as possible.  You can reduce the number of germs on your skin by washing with CHG (chlorahexidine gluconate) soap before surgery.  CHG is an antiseptic cleaner which kills germs and bonds with the skin to continue killing germs even after washing. Please DO NOT use if you have an allergy to CHG or antibacterial soaps.  If your skin becomes reddened/irritated stop using the CHG and inform your nurse when you arrive at Short  Stay. Do not shave (including legs and underarms) for at least 48 hours prior to the first CHG shower.  You may shave your face/neck. Please follow these instructions carefully:  1.  Shower with CHG Soap the night before surgery and the  morning of Surgery.  2.  If you choose to wash your hair, wash your hair first as usual with your  normal  shampoo.  3.  After you shampoo, rinse your hair and body thoroughly to remove the  shampoo.                           4.  Use CHG as you would any other liquid soap.  You can apply chg directly  to the skin and wash  Gently with a scrungie or clean washcloth.  5.  Apply the CHG Soap to your body ONLY FROM THE NECK DOWN.   Do not use on face/ open                           Wound or open sores. Avoid contact with eyes, ears mouth and genitals (private parts).                       Wash face,  Genitals (private parts) with your normal soap.             6.  Wash thoroughly, paying special attention to the area where your surgery  will be performed.  7.  Thoroughly rinse your body with warm water from the neck down.  8.  DO NOT shower/wash with your normal soap after using and rinsing off  the CHG Soap.                9.  Pat yourself dry with a clean towel.            10.  Wear clean pajamas.            11.  Place clean sheets on your bed the night of your first shower and do not  sleep with pets. Day of Surgery : Do not apply any lotions/deodorants the morning of surgery.  Please wear clean clothes to the hospital/surgery center.  FAILURE TO FOLLOW THESE INSTRUCTIONS MAY RESULT IN THE CANCELLATION OF YOUR SURGERY PATIENT SIGNATURE_________________________________  NURSE SIGNATURE__________________________________  ________________________________________________________________________  WHAT IS A BLOOD TRANSFUSION? Blood Transfusion Information  A transfusion is the replacement of blood or some of its parts. Blood is made up of  multiple cells which provide different functions.  Red blood cells carry oxygen and are used for blood loss replacement.  White blood cells fight against infection.  Platelets control bleeding.  Plasma helps clot blood.  Other blood products are available for specialized needs, such as hemophilia or other clotting disorders. BEFORE THE TRANSFUSION  Who gives blood for transfusions?   Healthy volunteers who are fully evaluated to make sure their blood is safe. This is blood bank blood. Transfusion therapy is the safest it has ever been in the practice of medicine. Before blood is taken from a donor, a complete history is taken to make sure that person has no history of diseases nor engages in risky social behavior (examples are intravenous drug use or sexual activity with multiple partners). The donor's travel history is screened to minimize risk of transmitting infections, such as malaria. The donated blood is tested for signs of infectious diseases, such as HIV and hepatitis. The blood is then tested to be sure it is compatible with you in order to minimize the chance of a transfusion reaction. If you or a relative donates blood, this is often done in anticipation of surgery and is not appropriate for emergency situations. It takes many days to process the donated blood. RISKS AND COMPLICATIONS Although transfusion therapy is very safe and saves many lives, the main dangers of transfusion include:   Getting an infectious disease.  Developing a transfusion reaction. This is an allergic reaction to something in the blood you were given. Every precaution is taken to prevent this. The decision to have a blood transfusion has been considered carefully by your caregiver before blood is given. Blood is not given unless the benefits outweigh  the risks. AFTER THE TRANSFUSION  Right after receiving a blood transfusion, you will usually feel much better and more energetic. This is especially true if  your red blood cells have gotten low (anemic). The transfusion raises the level of the red blood cells which carry oxygen, and this usually causes an energy increase.  The nurse administering the transfusion will monitor you carefully for complications. HOME CARE INSTRUCTIONS  No special instructions are needed after a transfusion. You may find your energy is better. Speak with your caregiver about any limitations on activity for underlying diseases you may have. SEEK MEDICAL CARE IF:   Your condition is not improving after your transfusion.  You develop redness or irritation at the intravenous (IV) site. SEEK IMMEDIATE MEDICAL CARE IF:  Any of the following symptoms occur over the next 12 hours:  Shaking chills.  You have a temperature by mouth above 102 F (38.9 C), not controlled by medicine.  Chest, back, or muscle pain.  People around you feel you are not acting correctly or are confused.  Shortness of breath or difficulty breathing.  Dizziness and fainting.  You get a rash or develop hives.  You have a decrease in urine output.  Your urine turns a dark color or changes to pink, red, or brown. Any of the following symptoms occur over the next 10 days:  You have a temperature by mouth above 102 F (38.9 C), not controlled by medicine.  Shortness of breath.  Weakness after normal activity.  The white part of the eye turns yellow (jaundice).  You have a decrease in the amount of urine or are urinating less often.  Your urine turns a dark color or changes to pink, red, or brown. Document Released: 01/30/2000 Document Revised: 04/26/2011 Document Reviewed: 09/18/2007 [facility] Patient Information 2014 [place], [place].  _______________________________________________________________________

## 2014-11-06 ENCOUNTER — Other Ambulatory Visit: Payer: Self-pay | Admitting: Radiology

## 2014-11-06 ENCOUNTER — Encounter (HOSPITAL_COMMUNITY)
Admission: RE | Admit: 2014-11-06 | Discharge: 2014-11-06 | Disposition: A | Discharge status: Home / Self Care | Payer: 59 | Source: Ambulatory Visit | Attending: Urology | Admitting: Urology

## 2014-11-06 ENCOUNTER — Encounter (HOSPITAL_COMMUNITY): Payer: Self-pay

## 2014-11-06 DIAGNOSIS — Z79891 Long term (current) use of opiate analgesic: Secondary | ICD-10-CM | POA: Diagnosis not present

## 2014-11-06 DIAGNOSIS — Z79899 Other long term (current) drug therapy: Secondary | ICD-10-CM | POA: Diagnosis not present

## 2014-11-06 DIAGNOSIS — N200 Calculus of kidney: Secondary | ICD-10-CM | POA: Diagnosis present

## 2014-11-06 DIAGNOSIS — Z87442 Personal history of urinary calculi: Secondary | ICD-10-CM | POA: Diagnosis not present

## 2014-11-06 DIAGNOSIS — G500 Trigeminal neuralgia: Secondary | ICD-10-CM | POA: Diagnosis not present

## 2014-11-06 DIAGNOSIS — N189 Chronic kidney disease, unspecified: Secondary | ICD-10-CM | POA: Diagnosis not present

## 2014-11-06 DIAGNOSIS — G709 Myoneural disorder, unspecified: Secondary | ICD-10-CM | POA: Diagnosis not present

## 2014-11-06 LAB — DIFFERENTIAL
Basophils Absolute: 0.1 10*3/uL (ref 0.0–0.1)
Basophils Relative: 2 %
EOS ABS: 0.5 10*3/uL (ref 0.0–0.7)
Eosinophils Relative: 8 %
LYMPHS ABS: 2.1 10*3/uL (ref 0.7–4.0)
LYMPHS PCT: 35 %
MONO ABS: 0.4 10*3/uL (ref 0.1–1.0)
Monocytes Relative: 6 %
NEUTROS ABS: 2.9 10*3/uL (ref 1.7–7.7)
NEUTROS PCT: 49 %

## 2014-11-06 LAB — BASIC METABOLIC PANEL
Anion gap: 7 (ref 5–15)
BUN: 15 mg/dL (ref 6–20)
CALCIUM: 9.1 mg/dL (ref 8.9–10.3)
CHLORIDE: 104 mmol/L (ref 101–111)
CO2: 25 mmol/L (ref 22–32)
CREATININE: 0.87 mg/dL (ref 0.61–1.24)
GFR calc non Af Amer: >60 mL/min (ref >60)
Glucose, Bld: 99 mg/dL (ref 65–99)
Potassium: 4.1 mmol/L (ref 3.5–5.1)
SODIUM: 136 mmol/L (ref 135–145)

## 2014-11-06 LAB — CBC
HEMATOCRIT: 40.7 % (ref 39.0–52.0)
Hemoglobin: 13.9 g/dL (ref 13.0–17.0)
MCH: 29.2 pg (ref 26.0–34.0)
MCHC: 34.2 g/dL (ref 30.0–36.0)
MCV: 85.5 fL (ref 78.0–100.0)
PLATELETS: 252 10*3/uL (ref 150–400)
RBC: 4.76 MIL/uL (ref 4.22–5.81)
RDW: 13.3 % (ref 11.5–15.5)
WBC: 6 10*3/uL (ref 4.0–10.5)

## 2014-11-07 ENCOUNTER — Ambulatory Visit (HOSPITAL_COMMUNITY): Payer: 59 | Admitting: Certified Registered Nurse Anesthetist

## 2014-11-07 ENCOUNTER — Encounter (HOSPITAL_COMMUNITY): Payer: Self-pay

## 2014-11-07 ENCOUNTER — Observation Stay (HOSPITAL_COMMUNITY)
Admission: RE | Admit: 2014-11-07 | Discharge: 2014-11-09 | Disposition: A | Discharge status: Home / Self Care | Payer: 59 | Source: Ambulatory Visit | Attending: Urology | Admitting: Urology

## 2014-11-07 ENCOUNTER — Ambulatory Visit (HOSPITAL_COMMUNITY): Payer: 59

## 2014-11-07 ENCOUNTER — Encounter (HOSPITAL_COMMUNITY)
Admission: RE | Disposition: A | Discharge status: Home / Self Care | Payer: Self-pay | Source: Ambulatory Visit | Attending: Urology

## 2014-11-07 ENCOUNTER — Ambulatory Visit (HOSPITAL_COMMUNITY)
Admission: RE | Admit: 2014-11-07 | Discharge: 2014-11-07 | Disposition: A | Discharge status: Home / Self Care | Payer: 59 | Source: Ambulatory Visit | Attending: Urology | Admitting: Urology

## 2014-11-07 DIAGNOSIS — Z79891 Long term (current) use of opiate analgesic: Secondary | ICD-10-CM | POA: Insufficient documentation

## 2014-11-07 DIAGNOSIS — R0689 Other abnormalities of breathing: Secondary | ICD-10-CM

## 2014-11-07 DIAGNOSIS — N200 Calculus of kidney: Secondary | ICD-10-CM

## 2014-11-07 DIAGNOSIS — G709 Myoneural disorder, unspecified: Secondary | ICD-10-CM | POA: Insufficient documentation

## 2014-11-07 DIAGNOSIS — Z79899 Other long term (current) drug therapy: Secondary | ICD-10-CM | POA: Insufficient documentation

## 2014-11-07 DIAGNOSIS — N189 Chronic kidney disease, unspecified: Secondary | ICD-10-CM | POA: Insufficient documentation

## 2014-11-07 DIAGNOSIS — Z87442 Personal history of urinary calculi: Secondary | ICD-10-CM | POA: Insufficient documentation

## 2014-11-07 DIAGNOSIS — G500 Trigeminal neuralgia: Secondary | ICD-10-CM | POA: Insufficient documentation

## 2014-11-07 HISTORY — PX: NEPHROLITHOTOMY: SHX5134

## 2014-11-07 LAB — TYPE AND SCREEN
ABO/RH(D): A NEG
ANTIBODY SCREEN: NEGATIVE

## 2014-11-07 LAB — PROTIME-INR
INR: 0.95 (ref 0.00–1.49)
PROTHROMBIN TIME: 12.9 s (ref 11.6–15.2)

## 2014-11-07 LAB — APTT: aPTT: 27 seconds (ref 24–37)

## 2014-11-07 SURGERY — NEPHROLITHOTOMY PERCUTANEOUS
Anesthesia: General | Laterality: Left

## 2014-11-07 MED ORDER — SODIUM CHLORIDE 0.45 % IV SOLN
INTRAVENOUS | Status: DC
  Administered 2014-11-07 – 2014-11-08 (×2): via INTRAVENOUS

## 2014-11-07 MED ORDER — ONDANSETRON HCL 4 MG/2ML IJ SOLN
INTRAMUSCULAR | Status: DC | PRN
  Administered 2014-11-07: 4 mg via INTRAVENOUS

## 2014-11-07 MED ORDER — SODIUM CHLORIDE 0.9 % IJ SOLN
INTRAMUSCULAR | Status: AC
Start: 2014-11-07 — End: 2014-11-07
  Filled 2014-11-07: qty 10

## 2014-11-07 MED ORDER — LIDOCAINE HCL (CARDIAC) 20 MG/ML IV SOLN
INTRAVENOUS | Status: DC | PRN
  Administered 2014-11-07: 100 mg via INTRAVENOUS

## 2014-11-07 MED ORDER — MIDAZOLAM HCL 2 MG/2ML IJ SOLN
INTRAMUSCULAR | Status: AC
  Filled 2014-11-07: qty 4

## 2014-11-07 MED ORDER — CEFAZOLIN SODIUM-DEXTROSE 2-3 GM-% IV SOLR
2.0000 g | INTRAVENOUS | Status: DC
Start: 2014-11-07 — End: 2014-11-07

## 2014-11-07 MED ORDER — CIPROFLOXACIN IN D5W 400 MG/200ML IV SOLN
INTRAVENOUS | Status: AC
  Filled 2014-11-07: qty 200

## 2014-11-07 MED ORDER — NEOSTIGMINE METHYLSULFATE 10 MG/10ML IV SOLN
INTRAVENOUS | Status: DC | PRN
  Administered 2014-11-07: 4 mg via INTRAVENOUS

## 2014-11-07 MED ORDER — ONDANSETRON HCL 4 MG/2ML IJ SOLN: 4.0000 mg | INTRAMUSCULAR | Status: DC | PRN

## 2014-11-07 MED ORDER — GLYCOPYRROLATE 0.2 MG/ML IJ SOLN
INTRAMUSCULAR | Status: AC
  Filled 2014-11-07: qty 3

## 2014-11-07 MED ORDER — FENTANYL CITRATE (PF) 100 MCG/2ML IJ SOLN
INTRAMUSCULAR | Status: AC
  Administered 2014-11-07: 25 ug via INTRAVENOUS
  Administered 2014-11-07 (×2): 50 ug via INTRAVENOUS
  Administered 2014-11-07: 75 ug via INTRAVENOUS
  Administered 2014-11-07: 50 ug via INTRAVENOUS
  Filled 2014-11-07: qty 2

## 2014-11-07 MED ORDER — GLYCOPYRROLATE 0.2 MG/ML IJ SOLN
INTRAMUSCULAR | Status: DC | PRN
  Administered 2014-11-07: 0.6 mg via INTRAVENOUS

## 2014-11-07 MED ORDER — PROMETHAZINE HCL 25 MG/ML IJ SOLN
INTRAMUSCULAR | Status: AC
  Filled 2014-11-07: qty 1

## 2014-11-07 MED ORDER — PROMETHAZINE HCL 25 MG/ML IJ SOLN
6.2500 mg | INTRAMUSCULAR | Status: AC | PRN
  Administered 2014-11-07 (×2): 6.25 mg via INTRAVENOUS

## 2014-11-07 MED ORDER — IOHEXOL 300 MG/ML  SOLN
INTRAMUSCULAR | Status: DC | PRN
  Administered 2014-11-07: 20 mL via URETHRAL

## 2014-11-07 MED ORDER — PROPOFOL 10 MG/ML IV BOLUS
INTRAVENOUS | Status: DC | PRN
  Administered 2014-11-07: 200 mg via INTRAVENOUS

## 2014-11-07 MED ORDER — LIDOCAINE HCL 1 % IJ SOLN
INTRAMUSCULAR | Status: AC
  Filled 2014-11-07: qty 20

## 2014-11-07 MED ORDER — FENTANYL CITRATE (PF) 100 MCG/2ML IJ SOLN
INTRAMUSCULAR | Status: AC
  Filled 2014-11-07: qty 2

## 2014-11-07 MED ORDER — DOCUSATE SODIUM 100 MG PO CAPS
100.0000 mg | ORAL_CAPSULE | Freq: Two times a day (BID) | ORAL | Status: DC
  Administered 2014-11-07 – 2014-11-09 (×4): 100 mg via ORAL
  Filled 2014-11-07 (×4): qty 1

## 2014-11-07 MED ORDER — ATROPINE SULFATE 0.1 MG/ML IJ SOLN
INTRAMUSCULAR | Status: AC
  Filled 2014-11-07: qty 10

## 2014-11-07 MED ORDER — CYCLOBENZAPRINE HCL 10 MG PO TABS: 10.0000 mg | ORAL_TABLET | Freq: Three times a day (TID) | ORAL | Status: DC | PRN

## 2014-11-07 MED ORDER — HYDROMORPHONE HCL 1 MG/ML IJ SOLN
1.0000 mg | INTRAMUSCULAR | Status: DC | PRN
  Administered 2014-11-07 (×2): 1 mg via INTRAVENOUS
  Filled 2014-11-07 (×2): qty 1

## 2014-11-07 MED ORDER — ROCURONIUM BROMIDE 100 MG/10ML IV SOLN
INTRAVENOUS | Status: AC
  Filled 2014-11-07: qty 1

## 2014-11-07 MED ORDER — CEFAZOLIN SODIUM-DEXTROSE 2-3 GM-% IV SOLR
INTRAVENOUS | Status: AC
  Filled 2014-11-07: qty 50

## 2014-11-07 MED ORDER — LACTATED RINGERS IV SOLN
INTRAVENOUS | Status: DC
  Administered 2014-11-07 (×2): via INTRAVENOUS
  Administered 2014-11-07: 1000 mL via INTRAVENOUS

## 2014-11-07 MED ORDER — NEOSTIGMINE METHYLSULFATE 10 MG/10ML IV SOLN
INTRAVENOUS | Status: AC
  Filled 2014-11-07: qty 1

## 2014-11-07 MED ORDER — ACETAMINOPHEN 325 MG PO TABS: 650.0000 mg | ORAL_TABLET | ORAL | Status: DC | PRN

## 2014-11-07 MED ORDER — MIDAZOLAM HCL 2 MG/2ML IJ SOLN
INTRAMUSCULAR | Status: AC
  Administered 2014-11-07 (×2): 1 mg via INTRAVENOUS
  Filled 2014-11-07: qty 4

## 2014-11-07 MED ORDER — EPHEDRINE SULFATE 50 MG/ML IJ SOLN
INTRAMUSCULAR | Status: AC
  Filled 2014-11-07: qty 1

## 2014-11-07 MED ORDER — HYDROMORPHONE HCL 1 MG/ML IJ SOLN
0.2500 mg | INTRAMUSCULAR | Status: DC | PRN
  Administered 2014-11-07: 0.5 mg via INTRAVENOUS
  Administered 2014-11-07 (×2): 0.25 mg via INTRAVENOUS

## 2014-11-07 MED ORDER — MIDAZOLAM HCL 2 MG/2ML IJ SOLN
INTRAMUSCULAR | Status: AC | PRN
  Administered 2014-11-07: 0.5 mg via INTRAVENOUS
  Administered 2014-11-07: 1 mg via INTRAVENOUS
  Administered 2014-11-07 (×3): 0.5 mg via INTRAVENOUS

## 2014-11-07 MED ORDER — FENTANYL CITRATE (PF) 100 MCG/2ML IJ SOLN
INTRAMUSCULAR | Status: AC | PRN
  Administered 2014-11-07: 25 ug via INTRAVENOUS
  Administered 2014-11-07: 50 ug via INTRAVENOUS
  Administered 2014-11-07: 25 ug via INTRAVENOUS

## 2014-11-07 MED ORDER — SODIUM CHLORIDE 0.9 % IR SOLN
Status: DC | PRN
  Administered 2014-11-07: 12000 mL

## 2014-11-07 MED ORDER — CEPHALEXIN 500 MG PO CAPS
500.0000 mg | ORAL_CAPSULE | Freq: Two times a day (BID) | ORAL | Status: DC
  Administered 2014-11-07 – 2014-11-09 (×4): 500 mg via ORAL
  Filled 2014-11-07 (×4): qty 1

## 2014-11-07 MED ORDER — LIDOCAINE HCL (CARDIAC) 20 MG/ML IV SOLN
INTRAVENOUS | Status: AC
  Filled 2014-11-07: qty 5

## 2014-11-07 MED ORDER — LACTATED RINGERS IV SOLN: INTRAVENOUS | Status: DC

## 2014-11-07 MED ORDER — GABAPENTIN 300 MG PO CAPS
300.0000 mg | ORAL_CAPSULE | Freq: Three times a day (TID) | ORAL | Status: DC
  Administered 2014-11-08: 300 mg via ORAL
  Filled 2014-11-07 (×2): qty 1

## 2014-11-07 MED ORDER — SODIUM CHLORIDE 0.9 % IV SOLN
INTRAVENOUS | Status: DC
  Administered 2014-11-07: 08:00:00 via INTRAVENOUS

## 2014-11-07 MED ORDER — ONDANSETRON HCL 4 MG/2ML IJ SOLN
INTRAMUSCULAR | Status: AC
  Filled 2014-11-07: qty 2

## 2014-11-07 MED ORDER — HYDROMORPHONE HCL 1 MG/ML IJ SOLN
INTRAMUSCULAR | Status: AC
  Filled 2014-11-07: qty 1

## 2014-11-07 MED ORDER — IOHEXOL 300 MG/ML  SOLN
45.0000 mL | Freq: Once | INTRAMUSCULAR | Status: DC | PRN
  Administered 2014-11-07: 45 mL
  Filled 2014-11-07: qty 50

## 2014-11-07 MED ORDER — FENTANYL CITRATE (PF) 250 MCG/5ML IJ SOLN
INTRAMUSCULAR | Status: AC
  Filled 2014-11-07: qty 25

## 2014-11-07 MED ORDER — OXYCODONE HCL 5 MG PO TABS
5.0000 mg | ORAL_TABLET | ORAL | Status: DC | PRN
  Administered 2014-11-07 – 2014-11-09 (×3): 5 mg via ORAL
  Filled 2014-11-07 (×3): qty 1

## 2014-11-07 MED ORDER — ROCURONIUM BROMIDE 100 MG/10ML IV SOLN
INTRAVENOUS | Status: DC | PRN
  Administered 2014-11-07: 10 mg via INTRAVENOUS
  Administered 2014-11-07: 50 mg via INTRAVENOUS
  Administered 2014-11-07 (×3): 10 mg via INTRAVENOUS

## 2014-11-07 MED ORDER — CIPROFLOXACIN IN D5W 400 MG/200ML IV SOLN
400.0000 mg | Freq: Once | INTRAVENOUS | Status: AC
  Administered 2014-11-07: 400 mg via INTRAVENOUS
  Filled 2014-11-07: qty 200

## 2014-11-07 MED ORDER — PROPOFOL 10 MG/ML IV BOLUS
INTRAVENOUS | Status: AC
  Filled 2014-11-07: qty 20

## 2014-11-07 MED ORDER — ONDANSETRON HCL 4 MG PO TABS
4.0000 mg | ORAL_TABLET | Freq: Four times a day (QID) | ORAL | Status: DC
  Administered 2014-11-07 – 2014-11-08 (×3): 4 mg via ORAL
  Filled 2014-11-07 (×3): qty 1

## 2014-11-07 SURGICAL SUPPLY — 56 items
APL SKNCLS STERI-STRIP NONHPOA (GAUZE/BANDAGES/DRESSINGS) ×2
BAG URINE DRAINAGE (UROLOGICAL SUPPLIES) ×2 IMPLANT
BASKET STONE 1.7 NGAGE (UROLOGICAL SUPPLIES) ×1 IMPLANT
BASKET ZERO TIP NITINOL 2.4FR (BASKET) ×1 IMPLANT
BENZOIN TINCTURE PRP APPL 2/3 (GAUZE/BANDAGES/DRESSINGS) ×4 IMPLANT
BLADE SURG 15 STRL LF DISP TIS (BLADE) ×1 IMPLANT
BLADE SURG 15 STRL SS (BLADE) ×2
BSKT STON RTRVL ZERO TP 2.4FR (BASKET)
CARTRIDGE STONEBREAK CO2 KIDNE (ELECTROSURGICAL) IMPLANT
CATCHER STONE W/TUBE ADAPTER (UROLOGICAL SUPPLIES) ×1 IMPLANT
CATH FOLEY 2W COUNCIL 20FR 5CC (CATHETERS) IMPLANT
CATH MULTI PURPOSE 16FR DRAIN (STENTS) ×1 IMPLANT
CATH ROBINSON RED A/P 20FR (CATHETERS) IMPLANT
CATH UROLOGY TORQUE 40 (MISCELLANEOUS) ×1 IMPLANT
CATH X-FORCE N30 NEPHROSTOMY (TUBING) ×2 IMPLANT
COVER SURGICAL LIGHT HANDLE (MISCELLANEOUS) ×1 IMPLANT
DRAPE C-ARM 42X120 X-RAY (DRAPES) ×2 IMPLANT
DRAPE CAMERA CLOSED 9X96 (DRAPES) ×2 IMPLANT
DRAPE LINGEMAN PERC (DRAPES) ×2 IMPLANT
DRAPE SURG IRRIG POUCH 19X23 (DRAPES) ×2 IMPLANT
DRSG PAD ABDOMINAL 8X10 ST (GAUZE/BANDAGES/DRESSINGS) ×4 IMPLANT
DRSG TEGADERM 8X12 (GAUZE/BANDAGES/DRESSINGS) ×2 IMPLANT
FIBER LASER FLEXIVA 1000 (UROLOGICAL SUPPLIES) IMPLANT
FIBER LASER FLEXIVA 200 (UROLOGICAL SUPPLIES) IMPLANT
FIBER LASER FLEXIVA 365 (UROLOGICAL SUPPLIES) IMPLANT
FIBER LASER FLEXIVA 550 (UROLOGICAL SUPPLIES) IMPLANT
FIBER LASER TRAC TIP (UROLOGICAL SUPPLIES) IMPLANT
GAUZE SPONGE 4X4 12PLY STRL (GAUZE/BANDAGES/DRESSINGS) ×2 IMPLANT
GLOVE BIOGEL M 8.0 STRL (GLOVE) ×7 IMPLANT
GOWN STRL REUS W/TWL XL LVL3 (GOWN DISPOSABLE) ×4 IMPLANT
GUIDEWIRE AMPLAZ .035X145 (WIRE) ×3 IMPLANT
GUIDEWIRE STR DUAL SENSOR (WIRE) ×1 IMPLANT
KIT BASIN OR (CUSTOM PROCEDURE TRAY) ×2 IMPLANT
MANIFOLD NEPTUNE II (INSTRUMENTS) ×2 IMPLANT
NS IRRIG 1000ML POUR BTL (IV SOLUTION) ×2 IMPLANT
PACK BASIC VI WITH GOWN DISP (CUSTOM PROCEDURE TRAY) ×1 IMPLANT
PACK CYSTO (CUSTOM PROCEDURE TRAY) ×2 IMPLANT
PROBE KIDNEY STONEBRKR 2.0X425 (ELECTROSURGICAL) ×1 IMPLANT
PROBE LITHOCLAST ULTRA 3.8X403 (UROLOGICAL SUPPLIES) IMPLANT
PROBE PNEUMATIC 1.0MMX570MM (UROLOGICAL SUPPLIES) ×1 IMPLANT
SET IRRIG Y TYPE TUR BLADDER L (SET/KITS/TRAYS/PACK) ×1 IMPLANT
SET WARMING FLUID IRRIGATION (MISCELLANEOUS) ×1 IMPLANT
SHEATH PEELAWAY SET 9 (SHEATH) ×1 IMPLANT
SHEATH X FORCE 10MMX22CM (SHEATH) ×1 IMPLANT
SPONGE LAP 4X18 X RAY DECT (DISPOSABLE) ×2 IMPLANT
STONE CATCHER W/TUBE ADAPTER (UROLOGICAL SUPPLIES) IMPLANT
SUT SILK 2 0 30  PSL (SUTURE) ×1
SUT SILK 2 0 30 PSL (SUTURE) ×1 IMPLANT
SYR 20CC LL (SYRINGE) ×4 IMPLANT
SYRINGE 10CC LL (SYRINGE) ×2 IMPLANT
TRAY FOLEY BAG SILVER LF 14FR (CATHETERS) ×1 IMPLANT
TRAY FOLEY W/METER SILVER 14FR (SET/KITS/TRAYS/PACK) ×1 IMPLANT
TRAY FOLEY W/METER SILVER 16FR (SET/KITS/TRAYS/PACK) ×2 IMPLANT
TUBE CONNECTING VINYL 14FR 30C (MISCELLANEOUS) ×1 IMPLANT
TUBING CONNECTING 10 (TUBING) ×5 IMPLANT
WATER STERILE IRR 1500ML POUR (IV SOLUTION) ×1 IMPLANT

## 2014-11-07 NOTE — Procedures (Signed)
Post-Procedure Note  Pre-operative Diagnosis: Left renal calculi and scheduled for stone removal in OR.       Post-operative Diagnosis: Same   Indications: Left renal calculi and scheduled for stone removal in OR.  Procedure Details:   Stones left kidney lower pole identified.  Antegrade nephrostogram performed.  Placed 5 Fr catheter in lower pole calyx and second 5 fr catheter in mid/pole calyx.   Findings: Stones in left kidney.  Both catheters extend into left ureter.  Complications: Self-limiting bradycardia in 40s with mild nausea and pain with breathing.      Condition: Stable  Plan: CXR prior to OR.

## 2014-11-07 NOTE — Anesthesia Procedure Notes (Signed)
Procedure Name: Intubation Date/Time: 11/07/2014 12:32 PM Performed by: Thornell Mule Pre-anesthesia Checklist: Patient identified, Emergency Drugs available, Suction available and Patient being monitored Patient Re-evaluated:Patient Re-evaluated prior to inductionOxygen Delivery Method: Circle System Utilized Preoxygenation: Pre-oxygenation with 100% oxygen Intubation Type: IV induction Ventilation: Mask ventilation without difficulty Laryngoscope Size: Miller and 3 Grade View: Grade I Tube type: Oral Tube size: 7.5 mm Number of attempts: 1 Airway Equipment and Method: Stylet and Oral airway Placement Confirmation: ETT inserted through vocal cords under direct vision,  positive ETCO2 and breath sounds checked- equal and bilateral Secured at: 21 cm Tube secured with: Tape Dental Injury: Teeth and Oropharynx as per pre-operative assessment

## 2014-11-07 NOTE — Anesthesia Preprocedure Evaluation (Addendum)
Anesthesia Evaluation  Patient identified by MRN, date of birth, ID band Patient awake    Reviewed: Allergy & Precautions, H&P , NPO status , Patient's Chart, lab work & pertinent test results  Airway Mallampati: II  TM Distance: >3 FB Neck ROM: full    Dental no notable dental hx. (+) Dental Advisory Given, Teeth Intact   Pulmonary neg pulmonary ROS,    Pulmonary exam normal breath sounds clear to auscultation       Cardiovascular Exercise Tolerance: Good negative cardio ROS Normal cardiovascular exam Rhythm:regular Rate:Normal     Neuro/Psych Right trigeminal neuralgia  Neuromuscular disease negative psych ROS   GI/Hepatic negative GI ROS, Neg liver ROS,   Endo/Other  negative endocrine ROS  Renal/GU negative Renal ROS  negative genitourinary   Musculoskeletal   Abdominal   Peds  Hematology negative hematology ROS (+)   Anesthesia Other Findings   Reproductive/Obstetrics negative OB ROS                             Anesthesia Physical Anesthesia Plan  ASA: II  Anesthesia Plan: General   Post-op Pain Management:    Induction: Intravenous  Airway Management Planned: Oral ETT  Additional Equipment:   Intra-op Plan:   Post-operative Plan: Extubation in OR  Informed Consent: I have reviewed the patients History and Physical, chart, labs and discussed the procedure including the risks, benefits and alternatives for the proposed anesthesia with the patient or authorized representative who has indicated his/her understanding and acceptance.   Dental Advisory Given  Plan Discussed with: CRNA and Surgeon  Anesthesia Plan Comments:         Anesthesia Quick Evaluation

## 2014-11-07 NOTE — Op Note (Signed)
Preoperative diagnosis: Left lower pole renal calculi  Postoperative diagnosis: Same  Principal procedure: Left percutaneous nephrolithotomy, right anterograde nephrogram, interpretive fluoroscopy  Surgeon: Dahlstedt  Anesthesia: Gen. endotracheal  Complications: None  Estimated blood loss: Approximately 300 mL  Drains: 16 French Cook catheter as percutaneous nephrostomy tube, 95 French Foley catheter  Specimens: Stones, to the patient's wife  Indications: 44 year old male with recurrent urolithiasis. He presented several years ago with left renal calculi. He underwent percutaneous nephrolithotomy at that time. He had a recurrent episode of flank pain this summer and had a UPJ stone as well as lower pole calculi. The UPJ stone was treated with lithotripsy as the patient did not want ureteroscopy and possible stent. He has remaining stone burden in the left lower pole which is significant. He would like to have the stones treated. The largest stone is approximately 11 mm in size, aggregate diameter of all the stones is approximate 4 cm. He is aware of the procedure percutaneous nephrolithotomy, risks and complications, and desires to proceed.  Description of procedure: The patient had access placed percutaneously into both the interpolar and lower pole calyces in interventional radiology. In the holding area, he was identified and properly marked. He received Cipro 400 mg IV prior to percutaneous access. He was taken to the operating room where general endotracheal anesthetic was administered. 79 French Foley catheter was then placed and was bladder under sterile conditions. He was then placed in the prone position, all extremities and pressure points were padded appropriately. Proper timeout was performed after his left flank was sterilely prepped and draped.  The Kumpe catheter in the lower pole access was open, and a guidewire was placed through this and into the bladder using fluoroscopic  guidance. The Kumpe catheter was removed, and the peel-away sheath was placed after incising the skin along the guidewire. A second safety wire was passed through the peel-away sheath. The peel-away sheath was then removed. I then placed the 28th French NephroMax balloon over top of the working guidewire, and the percutaneous tract was dilated to 20 atm of pressure. I then guided the 39 French access sheath over top of the balloon. The balloon was then deflated and removed. The nephroscope was then passed through. The lower pole calyces were inspected. I did not see any stone fragments either in the lower pole calyces which were visualized or the renal pelvis. At this point, I did not feel that lower pole access would adequately allow me to remove his stone burden. I then removed the access sheath, and place a Kumpe catheter over the working guidewire down along the ureter.  The interpolar Kumpe catheter was used to establish access to the ureter-first the working guidewire was passed through this down to the bladder using fluoroscopic guidance. The Kumpe catheter was removed, again the peel-away sheath was introduced along/over the guidewire and down into the ureter. The inner core was removed and a second guidewire was introduced to the bladder through the ureter fluoroscopically. The peel-away sheath was then removed. Again, the balloon dilator was used to establish a tract using fluoroscopic guidance to navigated into the renal pelvis. The tract was dilated to 20 atm of pressure. The access sheath was then placed over top of the balloon, the balloon deflated once adequate positioning was noted, and the balloon was removed. The nephroscope was advanced through the access sheath and into the renal pelvis. The pelvis was inspected. Both guidewires were seen as well as the lower pole Kumpe catheter. I  could not navigate the nephroscope into the lower pole calyces due to the angle. No stones were seen in the  pelvis. I then removed the rigid nephroscope and passed the flexible nephroscope. Lower pole calyces were identified/inspected. It took some time to find a calyx/calyces that held the stones. The stones were finally identified, and carefully extracted using the engage and [employer]. Multiple small fragments as well as multiple larger fragments approximately 1 cm were removed. Careful inspection on multiple occasions of all the calyces and the renal pelvis following extraction of the stones failed to reveal any more stones. After a careful inspection was completed, I felt the procedure could be terminated. The nephroscope was removed. Over top of the working guidewire, through the access sheath, I navigated a 16 French Cook pigtail catheter. Fluoroscopic guidance was used to position this in the renal pelvis. Once the access sheath was removed, the catheter was curled by pulling the engagement string. At this point, with the guidewires removed, a nephrogram was performed.  30 mL of a 50-50 solution of saline and Omnipaque was used to perform the nephrogram. This revealed intact calyces with no pelvic or calyceal extravasation. Under pressure, I did not see any contrast passed through the ureter, however.  At this point, with guidewires and the Kumpe catheter from the lower pole removed, the procedure was terminated. The pigtail catheter had some bleeding through it for while, which terminated with about 5 minutes of pressure to the percutaneous tube site. Incisions were closed with 0 silk placed in vertical mattress fashions. Dry sterile dressings were placed. The drain was hooked to dependent drainage.  The patient tolerated procedure well. His awakened and taken to the PACU in stable condition.

## 2014-11-07 NOTE — Anesthesia Postprocedure Evaluation (Signed)
  Anesthesia Post-op Note  Patient: Mitchell Dougherty  Procedure(s) Performed: Procedure(s) (LRB): NEPHROLITHOTOMY PERCUTANEOUS (Left)  Patient Location: PACU  Anesthesia Type: General  Level of Consciousness: awake and alert   Airway and Oxygen Therapy: Patient Spontanous Breathing  Post-op Pain: mild  Post-op Assessment: Post-op Vital signs reviewed, Patient's Cardiovascular Status Stable, Respiratory Function Stable, Patent Airway and No signs of Nausea or vomiting  Last Vitals:  Filed Vitals:   11/07/14 1615  BP:   Pulse: 56  Temp:   Resp: 12    Post-op Vital Signs: stable   Complications: No apparent anesthesia complications

## 2014-11-07 NOTE — Transfer of Care (Signed)
Immediate Anesthesia Transfer of Care Note  Patient: Mitchell Dougherty  Procedure(s) Performed: Procedure(s): NEPHROLITHOTOMY PERCUTANEOUS (Left)  Patient Location: PACU  Anesthesia Type:General  Level of Consciousness: awake, alert  and oriented  Airway & Oxygen Therapy: Patient Spontanous Breathing and Patient connected to face mask oxygen  Post-op Assessment: Report given to RN and Post -op Vital signs reviewed and stable  Post vital signs: Reviewed and stable  Last Vitals:  Filed Vitals:   11/07/14 0735  BP: 116/74  Pulse: 64  Temp: 36.9 C  Resp: 18    Complications: No apparent anesthesia complications

## 2014-11-07 NOTE — H&P (Signed)
Chief Complaint: Patient was seen in consultation today for left percutaneous nephrostomy/nephroureteral catheter placement   Referring Physician(s): Dahlstedt,S  History of Present Illness: Mitchell Dougherty is a 44 y.o. male with history of nephrolithiasis and prior left PCN/PCNL in 2011. He now presents with recurrent left UPJ/lower pole calyx stones, s/p ESWL in 07/2014. He is scheduled today for left percutaneous nephrostomy/nephroureteral catheter placement prior to nephrolithotomy to treat residual stone burden.  Past Medical History  Diagnosis Date  . Back pain   . Chronic kidney disease     Past Surgical History  Procedure Laterality Date  . Lithrotripsy    . Percutaneous nephrostomy    . Wisdom tooth extraction  2005    Allergies: Review of patient's allergies indicates no known allergies.  Medications: Prior to Admission medications   Medication Sig Start Date End Date Taking? Authorizing Provider  Multiple Vitamin (MULTIVITAMIN WITH MINERALS) TABS tablet Take 1 tablet by mouth daily.   Yes Historical Provider, MD  Omega-3 Fatty Acids (FISH OIL) 1000 MG CAPS Take 1 capsule by mouth daily.   Yes Historical Provider, MD  cyclobenzaprine (FLEXERIL) 10 MG tablet Take 10 mg by mouth 3 (three) times daily as needed for muscle spasms.    Historical Provider, MD  gabapentin (NEURONTIN) 300 MG capsule Take 1 capsule (300 mg total) by mouth 3 (three) times daily. Patient not taking: Reported on 07/17/2014 07/16/14   Drema Dallas, DO  ondansetron (ZOFRAN) 4 MG tablet Take 1 tablet (4 mg total) by mouth every 6 (six) hours. Patient not taking: Reported on 10/17/2014 07/14/14   Jerelyn Scott, MD  oxyCODONE-acetaminophen (PERCOCET/ROXICET) 5-325 MG per tablet Take 1-2 tablets by mouth every 6 (six) hours as needed for severe pain. Patient taking differently: Take 1-2 tablets by mouth every 6 (six) hours as needed for severe pain (kidney stones).  07/14/14   Jerelyn Scott, MD      Family History  Problem Relation Age of Onset  . Cancer Mother     colon   . Kidney Stones Father   . Cancer Father     renal cell   . Cancer Sister     unknown   . Nephrolithiasis Brother   . Diabetes Maternal Grandmother     Social History   Social History  . Marital Status: Married    Spouse Name: N/A  . Number of Children: N/A  . Years of Education: N/A   Social History Main Topics  . Smoking status: Never Smoker   . Smokeless tobacco: Never Used  . Alcohol Use: No     Comment: rare   . Drug Use: No  . Sexual Activity:    Partners: Female   Other Topics Concern  . None   Social History Narrative      Review of Systems  Constitutional: Negative for fever and chills.  Respiratory: Negative for cough and shortness of breath.   Cardiovascular: Negative for chest pain.  Gastrointestinal: Negative for nausea, vomiting, abdominal pain and blood in stool.  Genitourinary: Negative for dysuria and hematuria.  Musculoskeletal: Negative for back pain.  Neurological: Negative for headaches.    Vital Signs: BP 116/74 mmHg  Pulse 64  Temp(Src) 98.4 F (36.9 C) (Oral)  Resp 18  SpO2 98%  Physical Exam  Constitutional: He is oriented to person, place, and time. He appears well-developed and well-nourished.  Cardiovascular: Normal rate and regular rhythm.   Pulmonary/Chest: Effort normal and breath sounds normal.  Abdominal: Soft.  Bowel sounds are normal. There is no tenderness.  Musculoskeletal: Normal range of motion. He exhibits no edema.  Neurological: He is alert and oriented to person, place, and time.    Mallampati Score:     Imaging: No results found.  Labs:  CBC:  Recent Labs  07/14/14 0944 07/14/14 1336 11/06/14 1130  WBC  --  9.0 6.0  HGB 15.0 13.9 13.9  HCT 44.0 40.5 40.7  PLT  --  285 252    COAGS:  Recent Labs  11/07/14 0750  INR 0.95  APTT 27    BMP:  Recent Labs  07/14/14 0944 11/06/14 0845  NA 142 136  K 4.0  4.1  CL 107 104  CO2  --  25  GLUCOSE 126* 99  BUN 19 15  CALCIUM  --  9.1  CREATININE 1.10 0.87  GFRNONAA  --  >60  GFRAA  --  >60    LIVER FUNCTION TESTS: No results for input(s): BILITOT, AST, ALT, ALKPHOS, PROT, ALBUMIN in the last 8760 hours.  TUMOR MARKERS: No results for input(s): AFPTM, CEA, CA199, CHROMGRNA in the last 8760 hours.  Assessment and Plan: ATZIN BUCHTA is a 44 y.o. male with history of nephrolithiasis and prior left PCN/PCNL in 2011. He now presents with recurrent left UPJ/lower pole calyx stones, s/p ESWL in 07/2014. He is scheduled today for left percutaneous nephrostomy/nephroureteral catheter placement prior to nephrolithotomy to treat residual stone burden.Risks and benefits discussed with the patient /wife including, but not limited to infection, bleeding, significant bleeding causing loss or decrease in renal function or damage to adjacent structures.  All of the patient's questions were answered, patient is agreeable to proceed. Consent signed and in chart.      Thank you for this interesting consult.  I greatly enjoyed meeting QUANDRE POLINSKI and look forward to participating in their care.  A copy of this report was sent to the requesting provider on this date.  Signed: D. Jeananne Rama 11/07/2014, 8:56 AM   I spent a total of 15 minutes in face to face in clinical consultation, greater than 50% of which was counseling/coordinating care for left percutaneous nephrostomy

## 2014-11-07 NOTE — Discharge Instructions (Signed)
DISCHARGE INSTRUCTIONS FOR PERCUTANEOUS STONE SURGERY °MEDICATIONS:  °1. DO NOT RESUME YOUR IBUPROFEN, or any other medicines like aspirin, motrin, excedrin, advil, aleve, vitamin E, fish oil as these can all cause bleeding x 10 days.  °2. Resume all your other meds from home - except do not take any other pain meds that you may have at home. ° °ACTIVITY °1. No strenuous activity, sexual activity, or lifting greater than 10 pounds for 2 weeks. °2. No driving while on narcotic pain medications °3. Drink plenty of water °4. Continue to walk at home - you can still get blood clots when you are at home, so keep active, but don't over do it. °5. May return to work in 1 week (but not heavy or strenuous activity).  ° °BATHING °You can shower.  Cover your wound with a dressing and remove the dressing immediately after the shower.  Do not submerge wound under water.  ° °WOUND CARE °Your wound will drain bloody fluid and may do so for 7-14 days. You have 2 options for dressings:  °1. You may use gauze and tape to dress your wound.  If you choose this method, then change the dressing as it becomes soaked.  Change it at least once daily until it stops draining. You may switch to a Band Aid once drainage stops. °2. If drainage is copious, you may use an ostomy device.  This is a bag with an andhesive circle.  The circle has a hole in the middle of it and you cut the hole to the size needed to fit the wound.  This will collect the drainage in the bag and allow you to drain the bag as needed.  ° °SIGNS/SYMPTOMS TO CALL: °1. Please call us if you have a fever greater than 101.5, uncontrolled nausea/vomiting, uncontrolled pain, dizziness, unable to urinate, bloody urine, chest pain, shortness of breath, leg swelling, leg pain, redness around wound, drainage from wound, or any other concerns or questions. °2. You can reach us at [phone]. °FOLLOW-UP ° ° °

## 2014-11-07 NOTE — H&P (Signed)
  Urology History and Physical Exam  CC: Kidney stones  HPI: 44 year old male presents for percutaneous nephrolithotomy of left lower pole stones. A significant stone burden on that side. Earlier this year, in the summer of 2016, he underwent lithotripsy of a large left UPJ stone. He has since passed that stone. He presents now for debulking/removal of all left renal calculi.  PMH: Past Medical History  Diagnosis Date  . Back pain   . Chronic kidney disease     PSH: Past Surgical History  Procedure Laterality Date  . Lithrotripsy    . Percutaneous nephrostomy    . Wisdom tooth extraction  2005    Allergies: No Known Allergies  Medications: No prescriptions prior to admission     Social History: Social History   Social History  . Marital Status: Married    Spouse Name: N/A  . Number of Children: N/A  . Years of Education: N/A   Occupational History  . Not on file.   Social History Main Topics  . Smoking status: Never Smoker   . Smokeless tobacco: Never Used  . Alcohol Use: No     Comment: rare   . Drug Use: No  . Sexual Activity:    Partners: Female   Other Topics Concern  . Not on file   Social History Narrative    Family History: Family History  Problem Relation Age of Onset  . Cancer Mother     colon   . Kidney Stones Father   . Cancer Father     renal cell   . Cancer Sister     unknown   . Nephrolithiasis Brother   . Diabetes Maternal Grandmother     Review of Systems: Positive: Not applicable Negative: .  A further 10 point review of systems was negative except what is listed in the HPI.                  Physical Exam: @ General: No acute distress.  Awake. Head:  Normocephalic.  Atraumatic. ENT:  EOMI.  Mucous membranes moist Neck:  Supple.  No lymphadenopathy. CV:  S1 present. S2 present. Regular rate. Pulmonary: Equal effort bilaterally.  Clear to auscultation bilaterally. Abdomen: Soft.  Non- tender to  palpation. Skin:  Normal turgor.  No visible rash. Extremity: No gross deformity of bilateral upper extremities.  No gross deformity of                             lower extremities. Neurologic: Alert. Appropriate mood.  Penis:  circumcised.  No lesions. Urethra: Orthotopic meatus. Scrotum: No lesions.  No ecchymosis.  No erythema. Testicles: Descended bilaterally.  No masses bilaterally. Epididymis: Palpable bilaterally. Nontender to palpation.  Studies:  Recent Labs     11/06/14  1130  HGB  13.9  WBC  6.0  PLT  252    Recent Labs     11/06/14  0845  NA  136  K  4.1  CL  104  CO2  25  BUN  15  CREATININE  0.87  CALCIUM  9.1  GFRNONAA  >60  GFRAA  >60     No results for input(s): INR, APTT in the last 72 hours.  Invalid input(s): PT   Invalid input(s): ABG    Assessment:  Left lower pole renal calculi  Plan: Percutaneous nephrolithotomy

## 2014-11-08 ENCOUNTER — Encounter (HOSPITAL_COMMUNITY): Payer: Self-pay | Admitting: Urology

## 2014-11-08 DIAGNOSIS — N200 Calculus of kidney: Secondary | ICD-10-CM | POA: Diagnosis not present

## 2014-11-08 LAB — HEMOGLOBIN AND HEMATOCRIT, BLOOD
HEMATOCRIT: 38.3 % — AB (ref 39.0–52.0)
HEMOGLOBIN: 12.8 g/dL — AB (ref 13.0–17.0)

## 2014-11-08 MED ORDER — CEPHALEXIN 500 MG PO CAPS: 500.0000 mg | ORAL_CAPSULE | Freq: Two times a day (BID) | ORAL | Status: DC

## 2014-11-08 MED ORDER — SODIUM CHLORIDE 0.9 % IV SOLN: 250.0000 mL | INTRAVENOUS | Status: DC | PRN

## 2014-11-08 MED ORDER — POLYETHYLENE GLYCOL 3350 17 G PO PACK
17.0000 g | PACK | Freq: Every day | ORAL | Status: DC
  Administered 2014-11-08 – 2014-11-09 (×2): 17 g via ORAL
  Filled 2014-11-08 (×2): qty 1

## 2014-11-08 MED ORDER — SODIUM CHLORIDE 0.9 % IJ SOLN: 3.0000 mL | INTRAMUSCULAR | Status: DC | PRN

## 2014-11-08 MED ORDER — TAMSULOSIN HCL 0.4 MG PO CAPS
0.4000 mg | ORAL_CAPSULE | Freq: Every day | ORAL | Status: DC
Start: 2014-11-08 — End: 2014-11-09
  Administered 2014-11-08 – 2014-11-09 (×2): 0.4 mg via ORAL
  Filled 2014-11-08 (×2): qty 1

## 2014-11-08 MED ORDER — SODIUM CHLORIDE 0.9 % IJ SOLN
3.0000 mL | Freq: Two times a day (BID) | INTRAMUSCULAR | Status: DC
  Administered 2014-11-08: 3 mL via INTRAVENOUS

## 2014-11-08 NOTE — Progress Notes (Signed)
Pt unable to tolerate nephrostomy tube being clamped greater than five minutes, same unclamped. Pt states he will try again at 0900. Up in room walking around at this time. Pain tolerable.

## 2014-11-08 NOTE — Progress Notes (Signed)
1 Day Post-Op Subjective: Patient reports he's comfortable  Objective: Vital signs in last 24 hours: Temp:  [97.4 F (36.3 C)-97.7 F (36.5 C)] 97.5 F (36.4 C) (09/23 0556) Pulse Rate:  [45-77] 77 (09/23 0556) Resp:  [11-19] 12 (09/23 0556) BP: (104-132)/(69-87) 118/80 mmHg (09/23 0556) SpO2:  [94 %-100 %] 98 % (09/23 0556) Weight:  [101.606 kg (224 lb)] 101.606 kg (224 lb) (09/22 1640)  Intake/Output from previous day: 09/22 0701 - 09/23 0700 In: 1772.5 [P.O.:300; I.V.:1472.5] Out: 2355 [Urine:2255; Blood:100] Intake/Output this shift:    Physical Exam:  Constitutional: Vital signs reviewed. WD WN in NAD   Eyes: PERRL, No scleral icterus.   Pulmonary/Chest: Normal effort Abdominal: Soft. Non-tender, non-distended, bowel sounds are normal, no masses, organomegaly, or guarding present. Dressing is dry Genitourinary: Extremities: No cyanosis or edema   Urine from nephrostomy tube is light pink without clots Lab Results:  Recent Labs  11/06/14 1130 11/08/14 0522  HGB 13.9 12.8*  HCT 40.7 38.3*   BMET  Recent Labs  11/06/14 0845  NA 136  K 4.1  CL 104  CO2 25  GLUCOSE 99  BUN 15  CREATININE 0.87  CALCIUM 9.1    Recent Labs  11/07/14 0750  INR 0.95   No results for input(s): LABURIN in the last 72 hours. Results for orders placed or performed during the hospital encounter of 01/28/10  Surgical pcr screen     Status: None   Collection Time: 01/20/10  9:23 AM  Result Value Ref Range Status   MRSA, PCR NEGATIVE NEGATIVE Final   Staphylococcus aureus  NEGATIVE Final    NEGATIVE        The Xpert SA Assay (FDA approved for NASAL specimens only), is one component of a comprehensive surveillance program.  It is not intended to diagnose infection nor to guide or monitor treatment.    Studies/Results: Dg Chest 1 View  11/07/2014   CLINICAL DATA:  Difficulty breathing.  EXAM: CHEST 1 VIEW  COMPARISON:  None.  FINDINGS: The heart size and mediastinal  contours are within normal limits. Both lungs are clear. No pneumothorax or pleural effusion is noted. The visualized skeletal structures are unremarkable.  IMPRESSION: No acute cardiopulmonary abnormality seen.   Electronically Signed   By: Lupita Raider, M.D.   On: 11/07/2014 12:04   Dg C-arm Gt 120 Min-no Report  11/07/2014   CLINICAL DATA: surgery   C-ARM GT 120 MINUTE  Fluoroscopy was utilized by the requesting physician.  No radiographic  interpretation.    Ir Ureteral Stent Left New Access W/o Sep Nephrostomy Cath  11/07/2014   CLINICAL DATA:  44 year old with left renal stones and scheduled for left percutaneous nephrolithotomy procedure.  EXAM: PLACEMENT OF TWO ANTEGRADE NEPHROURETERAL CATHETERS WITH FLUOROSCOPIC GUIDANCE.  Physician: Rachelle Hora. Lowella Dandy, MD  FLUOROSCOPY TIME:  340 mGy, 23 minutes, 24 seconds  MEDICATIONS: 400 mg ciprofloxacin, 3 mg Versed, 100 mcg fentanyl. Insert moderate sedation A radiology nurse monitored the patient for moderate sedation.  ANESTHESIA/SEDATION: Moderate sedation time: 65 minutes  PROCEDURE: The procedure was explained to the patient. The risks and benefits of the procedure were discussed and the patient's questions were addressed. Informed consent was obtained from the patient. The left flank was marked prior to the procedure. The patient was placed prone on the interventional table. The left flank was prepped and draped in sterile fashion.  Stones in the left kidney lower pole region were identified with fluoroscopy. Left flank was anesthetized with 1%  lidocaine. 22 gauge needle was directed towards these lower pole stones. Eventually, the needle was directed onto the stones and contrast was used to opacify the renal collecting system. A 0.018 wire was advanced in the renal collecting system and an Accustick dilator set was placed. This catheter was exchanged for a 5 French catheter. Dr. Retta Diones had requested a mid or upper pole access for this procedure. As a  result, attention was directed towards the upper/mid calyx. A 22 gauge needle was directed into this calyx with fluoroscopic guidance. A Glidewire was advanced into the ureter. An Accustick dilator set was placed and a upsized to a 5 [place] catheter which was advanced into the bladder. The lower pole 5 French catheter was advanced into the left ureter. Both catheters were sutured to the skin.  Patient had a short episode of bradycardia in the 40s, baseline heart rate in the 50s. Patient also had some nausea and a complained of pain with deep inspiration. The nausea and bradycardia with self-limiting. Following the procedure, the patient had a chest x-ray which showed no evidence for pneumothorax. No acute findings identified on the chest radiograph. Patient was then transferred to the operating room.  FINDINGS: Multiple stones in the left kidney lower pole region. Access was obtained from a lower pole calyx and catheter was advanced into the right ureter. A second access was obtained from a upper/midpole calyx. This 5 French catheter was advanced into the urinary bladder.  Estimated blood loss: Minimal  COMPLICATIONS: Short episode of nausea and bradycardia. Patient's blood pressure remained stable during the short episode of bradycardia.  IMPRESSION: Placement of antegrade nephroureteral catheters in the left kidney for a percutaneous nephrolithotomy procedure. One catheter was placed in the lower pole and the other was placed in a upper/ mid calyx.   Electronically Signed   By: Richarda Overlie M.D.   On: 11/07/2014 17:42    Assessment/Plan:   Postoperative day #1 left percutaneous nephrolithotomy. He is doing well currently.    I have clamped his nephrostomy tube. His Foley catheter has been removed. If he does well with the percutaneous tube clamped, I will remove this afternoon and let him go home     Marcine Matar M 11/08/2014, 7:47 AM

## 2014-11-08 NOTE — Progress Notes (Signed)
Pt still unable to tolerate keeping tube clamp due to the pain.

## 2014-11-09 DIAGNOSIS — N200 Calculus of kidney: Secondary | ICD-10-CM | POA: Diagnosis not present

## 2014-11-09 NOTE — Discharge Summary (Signed)
Ihor Gully, MD Physician Signed Urology Discharge Summaries 11/09/2014 8:39 AM    Expand All Collapse All   Physician Discharge Summary  Patient ID: Mitchell Dougherty MRN: [id number] DOB/AGE: 46-Jul-1972 46 y.o.  Admit date: 11/07/2014 Discharge date: 11/09/2014  Admission Diagnoses: LEFT RENAL CALCULI  Discharge Diagnoses:  Active Problems:  Nephrolithiasis  Calculus of kidney   Discharged Condition: good  Hospital Course: He underwent an elective left percutaneous nephrolithotomy without incident. The following day his nephrostomy tube was clamped but he had significant discomfort with the tube clamped. He then noted hematuria with some clots and since that time his urine is cleared. He was maintained in the hospital overnight and this morning has clamped his tube and it had remain clamped for 30 minutes with no pain. I recommended he move about and leave the tube clamped for another 30 minutes and when I checked back on him he had redeveloped flank pain and had also seen some hematuria with clots. I therefore left the nephrostomy tube in and he was discharged home with the nephrostomy tube to drainage and will clamp the tube 2-3 times a day in order to determine when the tube can be removed. He will contact the office for follow-up on Monday.   Imaging Results (Last 48 hours)    Significant Diagnostic Studies: Dg Chest 1 View  11/07/2014 CLINICAL DATA: Difficulty breathing. EXAM: CHEST 1 VIEW COMPARISON: None. FINDINGS: The heart size and mediastinal contours are within normal limits. Both lungs are clear. No pneumothorax or pleural effusion is noted. The visualized skeletal structures are unremarkable. IMPRESSION: No acute cardiopulmonary abnormality seen. Electronically Signed By: Lupita Raider, M.D. On: 11/07/2014 12:04   Dg C-arm Gt 120 Min-no Report  11/07/2014 CLINICAL DATA: surgery C-ARM GT 120 MINUTE Fluoroscopy was utilized by the requesting physician.  No radiographic interpretation.   Ir Ureteral Stent Left New Access W/o Sep Nephrostomy Cath  11/07/2014 CLINICAL DATA: 44 year old with left renal stones and scheduled for left percutaneous nephrolithotomy procedure. EXAM: PLACEMENT OF TWO ANTEGRADE NEPHROURETERAL CATHETERS WITH FLUOROSCOPIC GUIDANCE. Physician: Rachelle Hora. Lowella Dandy, MD FLUOROSCOPY TIME: 340 mGy, 23 minutes, 24 seconds MEDICATIONS: 400 mg ciprofloxacin, 3 mg Versed, 100 mcg fentanyl. Insert moderate sedation A radiology nurse monitored the patient for moderate sedation. ANESTHESIA/SEDATION: Moderate sedation time: 65 minutes PROCEDURE: The procedure was explained to the patient. The risks and benefits of the procedure were discussed and the patient's questions were addressed. Informed consent was obtained from the patient. The left flank was marked prior to the procedure. The patient was placed prone on the interventional table. The left flank was prepped and draped in sterile fashion. Stones in the left kidney lower pole region were identified with fluoroscopy. Left flank was anesthetized with 1% lidocaine. 22 gauge needle was directed towards these lower pole stones. Eventually, the needle was directed onto the stones and contrast was used to opacify the renal collecting system. A 0.018 wire was advanced in the renal collecting system and an Accustick dilator set was placed. This catheter was exchanged for a 5 French catheter. Dr. Retta Diones had requested a mid or upper pole access for this procedure. As a result, attention was directed towards the upper/mid calyx. A 22 gauge needle was directed into this calyx with fluoroscopic guidance. A Glidewire was advanced into the ureter. An Accustick dilator set was placed and a upsized to a 5 [place] catheter which was advanced into the bladder. The lower pole 5 French catheter was advanced into the left ureter. Both  catheters were sutured to the skin. Patient had a short episode of bradycardia  in the 40s, baseline heart rate in the 50s. Patient also had some nausea and a complained of pain with deep inspiration. The nausea and bradycardia with self-limiting. Following the procedure, the patient had a chest x-ray which showed no evidence for pneumothorax. No acute findings identified on the chest radiograph. Patient was then transferred to the operating room. FINDINGS: Multiple stones in the left kidney lower pole region. Access was obtained from a lower pole calyx and catheter was advanced into the right ureter. A second access was obtained from a upper/midpole calyx. This 5 French catheter was advanced into the urinary bladder. Estimated blood loss: Minimal COMPLICATIONS: Short episode of nausea and bradycardia. Patient's blood pressure remained stable during the short episode of bradycardia. IMPRESSION: Placement of antegrade nephroureteral catheters in the left kidney for a percutaneous nephrolithotomy procedure. One catheter was placed in the lower pole and the other was placed in a upper/ mid calyx. Electronically Signed By: Richarda Overlie M.D. On: 11/07/2014 17:42     Discharge Exam: Blood pressure 130/73, pulse 73, temperature 97.8 F (36.6 C), temperature source Oral, resp. rate 12, height  (1.778 m), weight 101.606 kg (224 lb), SpO2 97 %. Abdomen is soft and nontender Flank reveals no ecchymoses. Nephrostomy site is free of any sign of infection.  Disposition: 01-Home or Self Care  Discharge Instructions    Discharge patient  Complete by: As directed             Medication List    TAKE these medications       cephALEXin 500 MG capsule  Commonly known as: KEFLEX  Take 1 capsule (500 mg total) by mouth 2 (two) times daily.     cyclobenzaprine 10 MG tablet  Commonly known as: FLEXERIL  Take 10 mg by mouth 3 (three) times daily as needed for muscle spasms.     Fish Oil 1000 MG Caps  Take 1 capsule by mouth daily.      gabapentin 300 MG capsule  Commonly known as: NEURONTIN  Take 1 capsule (300 mg total) by mouth 3 (three) times daily.     multivitamin with minerals Tabs tablet  Take 1 tablet by mouth daily.     ondansetron 4 MG tablet  Commonly known as: ZOFRAN  Take 1 tablet (4 mg total) by mouth every 6 (six) hours.     oxyCODONE-acetaminophen 5-325 MG per tablet  Commonly known as: PERCOCET/ROXICET  Take 1-2 tablets by mouth every 6 (six) hours as needed for severe pain.           Follow-up Information    Call Chelsea Aus, MD.   Specialty: Urology   Contact information:   [address] [place] [address] [phone]       Signed: Garnett Farm 11/09/2014, 8:39 AM        Routing History     Date/Time From To Method   11/09/2014 8:41 AM Ihor Gully, MD Ihor Gully, MD Fax   11/09/2014 8:41 AM Ihor Gully, MD Johny Blamer, MD Fax

## 2014-11-09 NOTE — Discharge Summary (Deleted)
Physician Discharge Summary  Patient ID: Mitchell Dougherty MRN: [id number] DOB/AGE: 1970/07/19 44 y.o.  Admit date: 11/07/2014 Discharge date: 11/09/2014  Admission Diagnoses: LEFT RENAL CALCULI  Discharge Diagnoses:  Active Problems:   Nephrolithiasis   Calculus of kidney   Discharged Condition: good  Hospital Course: He underwent an elective left percutaneous nephrolithotomy without incident. The following day his nephrostomy tube was clamped but he had significant discomfort with the tube clamped. He then noted hematuria with some clots and since that time his urine is cleared. He was maintained in the hospital overnight and this morning has clamped his tube and it has remain clamped for one hour without any pain. His nephrostomy tube was therefore removed and he was discharged home.  Significant Diagnostic Studies: Dg Chest 1 View  11/07/2014   CLINICAL DATA:  Difficulty breathing.  EXAM: CHEST 1 VIEW  COMPARISON:  None.  FINDINGS: The heart size and mediastinal contours are within normal limits. Both lungs are clear. No pneumothorax or pleural effusion is noted. The visualized skeletal structures are unremarkable.  IMPRESSION: No acute cardiopulmonary abnormality seen.   Electronically Signed   By: Lupita Raider, M.D.   On: 11/07/2014 12:04   Dg C-arm Gt 120 Min-no Report  11/07/2014   CLINICAL DATA: surgery   C-ARM GT 120 MINUTE  Fluoroscopy was utilized by the requesting physician.  No radiographic  interpretation.    Ir Ureteral Stent Left New Access W/o Sep Nephrostomy Cath  11/07/2014   CLINICAL DATA:  44 year old with left renal stones and scheduled for left percutaneous nephrolithotomy procedure.  EXAM: PLACEMENT OF TWO ANTEGRADE NEPHROURETERAL CATHETERS WITH FLUOROSCOPIC GUIDANCE.  Physician: Rachelle Hora. Lowella Dandy, MD  FLUOROSCOPY TIME:  340 mGy, 23 minutes, 24 seconds  MEDICATIONS: 400 mg ciprofloxacin, 3 mg Versed, 100 mcg fentanyl. Insert moderate sedation A radiology nurse monitored  the patient for moderate sedation.  ANESTHESIA/SEDATION: Moderate sedation time: 65 minutes  PROCEDURE: The procedure was explained to the patient. The risks and benefits of the procedure were discussed and the patient's questions were addressed. Informed consent was obtained from the patient. The left flank was marked prior to the procedure. The patient was placed prone on the interventional table. The left flank was prepped and draped in sterile fashion.  Stones in the left kidney lower pole region were identified with fluoroscopy. Left flank was anesthetized with 1% lidocaine. 22 gauge needle was directed towards these lower pole stones. Eventually, the needle was directed onto the stones and contrast was used to opacify the renal collecting system. A 0.018 wire was advanced in the renal collecting system and an Accustick dilator set was placed. This catheter was exchanged for a 5 French catheter. Dr. Retta Diones had requested a mid or upper pole access for this procedure. As a result, attention was directed towards the upper/mid calyx. A 22 gauge needle was directed into this calyx with fluoroscopic guidance. A Glidewire was advanced into the ureter. An Accustick dilator set was placed and a upsized to a 5 [place] catheter which was advanced into the bladder. The lower pole 5 French catheter was advanced into the left ureter. Both catheters were sutured to the skin.  Patient had a short episode of bradycardia in the 40s, baseline heart rate in the 50s. Patient also had some nausea and a complained of pain with deep inspiration. The nausea and bradycardia with self-limiting. Following the procedure, the patient had a chest x-ray which showed no evidence for pneumothorax. No acute findings identified on  the chest radiograph. Patient was then transferred to the operating room.  FINDINGS: Multiple stones in the left kidney lower pole region. Access was obtained from a lower pole calyx and catheter was advanced into the  right ureter. A second access was obtained from a upper/midpole calyx. This 5 French catheter was advanced into the urinary bladder.  Estimated blood loss: Minimal  COMPLICATIONS: Short episode of nausea and bradycardia. Patient's blood pressure remained stable during the short episode of bradycardia.  IMPRESSION: Placement of antegrade nephroureteral catheters in the left kidney for a percutaneous nephrolithotomy procedure. One catheter was placed in the lower pole and the other was placed in a upper/ mid calyx.   Electronically Signed   By: Richarda Overlie M.D.   On: 11/07/2014 17:42    Discharge Exam: Blood pressure 130/73, pulse 73, temperature 97.8 F (36.6 C), temperature source Oral, resp. rate 12, height  (1.778 m), weight 101.606 kg (224 lb), SpO2 97 %. Abdomen is soft and nontender Flank reveals no ecchymoses. Nephrostomy site is free of any sign of infection.  Disposition: 01-Home or Self Care  Discharge Instructions    Discharge patient    Complete by:  As directed             Medication List    TAKE these medications        cephALEXin 500 MG capsule  Commonly known as:  KEFLEX  Take 1 capsule (500 mg total) by mouth 2 (two) times daily.     cyclobenzaprine 10 MG tablet  Commonly known as:  FLEXERIL  Take 10 mg by mouth 3 (three) times daily as needed for muscle spasms.     Fish Oil 1000 MG Caps  Take 1 capsule by mouth daily.     gabapentin 300 MG capsule  Commonly known as:  NEURONTIN  Take 1 capsule (300 mg total) by mouth 3 (three) times daily.     multivitamin with minerals Tabs tablet  Take 1 tablet by mouth daily.     ondansetron 4 MG tablet  Commonly known as:  ZOFRAN  Take 1 tablet (4 mg total) by mouth every 6 (six) hours.     oxyCODONE-acetaminophen 5-325 MG per tablet  Commonly known as:  PERCOCET/ROXICET  Take 1-2 tablets by mouth every 6 (six) hours as needed for severe pain.           Follow-up Information    Call Chelsea Aus,  MD.   Specialty:  Urology   Contact information:   [address] [place] [address] [phone]       Signed: Garnett Farm 11/09/2014, 8:39 AM

## 2014-11-09 NOTE — Progress Notes (Signed)
Pt reports attempted tube clamping of nephrostomy. He reports it was unsuccessful with increased pain. Will continue to monitor.

## 2014-11-13 ENCOUNTER — Emergency Department (HOSPITAL_COMMUNITY): Payer: 59

## 2014-11-13 ENCOUNTER — Encounter (HOSPITAL_COMMUNITY): Payer: Self-pay | Admitting: *Deleted

## 2014-11-13 ENCOUNTER — Inpatient Hospital Stay (HOSPITAL_COMMUNITY)
Admission: EM | Admit: 2014-11-13 | Discharge: 2014-11-15 | DRG: 195 | Disposition: A | Discharge status: Home / Self Care | Payer: 59 | Attending: Internal Medicine | Admitting: Internal Medicine

## 2014-11-13 DIAGNOSIS — D500 Iron deficiency anemia secondary to blood loss (chronic): Secondary | ICD-10-CM | POA: Diagnosis present

## 2014-11-13 DIAGNOSIS — Z833 Family history of diabetes mellitus: Secondary | ICD-10-CM

## 2014-11-13 DIAGNOSIS — R109 Unspecified abdominal pain: Secondary | ICD-10-CM | POA: Diagnosis present

## 2014-11-13 DIAGNOSIS — N189 Chronic kidney disease, unspecified: Secondary | ICD-10-CM | POA: Diagnosis present

## 2014-11-13 DIAGNOSIS — Y95 Nosocomial condition: Secondary | ICD-10-CM | POA: Diagnosis present

## 2014-11-13 DIAGNOSIS — N200 Calculus of kidney: Secondary | ICD-10-CM

## 2014-11-13 DIAGNOSIS — Z809 Family history of malignant neoplasm, unspecified: Secondary | ICD-10-CM

## 2014-11-13 DIAGNOSIS — J189 Pneumonia, unspecified organism: Secondary | ICD-10-CM | POA: Diagnosis not present

## 2014-11-13 DIAGNOSIS — R079 Chest pain, unspecified: Secondary | ICD-10-CM | POA: Diagnosis not present

## 2014-11-13 HISTORY — DX: Calculus of kidney: N20.0

## 2014-11-13 HISTORY — DX: Trigeminal neuralgia: G50.0

## 2014-11-13 LAB — CBC WITH DIFFERENTIAL/PLATELET
BASOS ABS: 0.1 10*3/uL (ref 0.0–0.1)
Basophils Relative: 1 %
EOS PCT: 2 %
Eosinophils Absolute: 0.2 10*3/uL (ref 0.0–0.7)
HEMATOCRIT: 36.7 % — AB (ref 39.0–52.0)
Hemoglobin: 12.7 g/dL — ABNORMAL LOW (ref 13.0–17.0)
LYMPHS ABS: 1.6 10*3/uL (ref 0.7–4.0)
LYMPHS PCT: 13 %
MCH: 29.6 pg (ref 26.0–34.0)
MCHC: 34.6 g/dL (ref 30.0–36.0)
MCV: 85.5 fL (ref 78.0–100.0)
MONO ABS: 0.9 10*3/uL (ref 0.1–1.0)
MONOS PCT: 8 %
NEUTROS ABS: 9 10*3/uL — AB (ref 1.7–7.7)
Neutrophils Relative %: 76 %
PLATELETS: 294 10*3/uL (ref 150–400)
RBC: 4.29 MIL/uL (ref 4.22–5.81)
RDW: 13.2 % (ref 11.5–15.5)
WBC: 11.7 10*3/uL — ABNORMAL HIGH (ref 4.0–10.5)

## 2014-11-13 LAB — COMPREHENSIVE METABOLIC PANEL
ALT: 22 U/L (ref 17–63)
AST: 20 U/L (ref 15–41)
Albumin: 4.5 g/dL (ref 3.5–5.0)
Alkaline Phosphatase: 48 U/L (ref 38–126)
Anion gap: 10 (ref 5–15)
BILIRUBIN TOTAL: 0.4 mg/dL (ref 0.3–1.2)
BUN: 16 mg/dL (ref 6–20)
CHLORIDE: 104 mmol/L (ref 101–111)
CO2: 24 mmol/L (ref 22–32)
Calcium: 9 mg/dL (ref 8.9–10.3)
Creatinine, Ser: 1.12 mg/dL (ref 0.61–1.24)
Glucose, Bld: 138 mg/dL — ABNORMAL HIGH (ref 65–99)
POTASSIUM: 3.5 mmol/L (ref 3.5–5.1)
Sodium: 138 mmol/L (ref 135–145)
TOTAL PROTEIN: 7.3 g/dL (ref 6.5–8.1)

## 2014-11-13 LAB — D-DIMER, QUANTITATIVE (NOT AT ARMC): D DIMER QUANT: 1.22 ug{FEU}/mL — AB (ref 0.00–0.48)

## 2014-11-13 MED ORDER — HYDROMORPHONE HCL 1 MG/ML IJ SOLN
1.0000 mg | Freq: Once | INTRAMUSCULAR | Status: AC
  Administered 2014-11-13: 1 mg via INTRAVENOUS
  Filled 2014-11-13: qty 1

## 2014-11-13 MED ORDER — ONDANSETRON HCL 4 MG/2ML IJ SOLN
4.0000 mg | Freq: Once | INTRAMUSCULAR | Status: AC
  Administered 2014-11-13: 4 mg via INTRAVENOUS
  Filled 2014-11-13: qty 2

## 2014-11-13 MED ORDER — IOHEXOL 350 MG/ML SOLN
100.0000 mL | Freq: Once | INTRAVENOUS | Status: AC | PRN
  Administered 2014-11-13: 100 mL via INTRAVENOUS

## 2014-11-13 MED ORDER — IOHEXOL 300 MG/ML  SOLN
25.0000 mL | Freq: Once | INTRAMUSCULAR | Status: AC | PRN
  Administered 2014-11-13: 25 mL via ORAL

## 2014-11-13 MED ORDER — SODIUM CHLORIDE 0.9 % IV BOLUS (SEPSIS)
1000.0000 mL | Freq: Once | INTRAVENOUS | Status: AC
  Administered 2014-11-13: 1000 mL via INTRAVENOUS

## 2014-11-13 NOTE — ED Provider Notes (Signed)
CSN: [id number]     Arrival date & time 11/13/14  2135 History   First MD Initiated Contact with Patient 11/13/14 2140     Chief Complaint  Patient presents with  . Flank Pain  . Abdominal Pain     (Consider location/radiation/quality/duration/timing/severity/associated sxs/prior Treatment) Patient is a 44 y.o. male presenting with flank pain and abdominal pain. The history is provided by the patient (The patient is complaining of upper abdominal pain with inspiration. He had a percutaneous urostomy tube removed today and the pain started shortly after that. Approximately about a week ago he had two left percutaneous nephrostomy tube placed.).  Flank Pain This is a new problem. The current episode started 6 to 12 hours ago. The problem occurs constantly. The problem has not changed since onset.Associated symptoms include abdominal pain. Pertinent negatives include no chest pain and no headaches. Nothing aggravates the symptoms. Nothing relieves the symptoms.  Abdominal Pain Associated symptoms: no chest pain, no cough, no diarrhea, no fatigue and no hematuria    The patient had a percutaneous nephrolithotomy done on the 24th. They put to left percutaneous nephrostomy tubes in. One was removed on the point for the other was removed today. Past Medical History  Diagnosis Date  . Back pain   . Chronic kidney disease    Past Surgical History  Procedure Laterality Date  . Lithrotripsy    . Percutaneous nephrostomy    . Wisdom tooth extraction  2005  . Nephrolithotomy Left 11/07/2014    Procedure: NEPHROLITHOTOMY PERCUTANEOUS;  Surgeon: Marcine Matar, MD;  Location: [facility];  Service: Urology;  Laterality: Left;   Family History  Problem Relation Age of Onset  . Cancer Mother     colon   . Kidney Stones Father   . Cancer Father     renal cell   . Cancer Sister     unknown   . Nephrolithiasis Brother   . Diabetes Maternal Grandmother    Social History  Substance Use Topics  .  Smoking status: Never Smoker   . Smokeless tobacco: Never Used  . Alcohol Use: No     Comment: rare     Review of Systems  Constitutional: Negative for appetite change and fatigue.  HENT: Negative for congestion, ear discharge and sinus pressure.   Eyes: Negative for discharge.  Respiratory: Negative for cough.   Cardiovascular: Negative for chest pain.  Gastrointestinal: Positive for abdominal pain. Negative for diarrhea.  Genitourinary: Positive for flank pain. Negative for frequency and hematuria.  Musculoskeletal: Negative for back pain.  Skin: Negative for rash.  Neurological: Negative for seizures and headaches.  Psychiatric/Behavioral: Negative for hallucinations.      Allergies  Review of patient's allergies indicates no known allergies.  Home Medications   Prior to Admission medications   Medication Sig Start Date End Date Taking? Authorizing Provider  cephALEXin (KEFLEX) 500 MG capsule Take 1 capsule (500 mg total) by mouth 2 (two) times daily. 11/08/14  Yes Marcine Matar, MD  cyclobenzaprine (FLEXERIL) 10 MG tablet Take 10 mg by mouth 3 (three) times daily as needed for muscle spasms.   Yes Historical Provider, MD  Multiple Vitamin (MULTIVITAMIN WITH MINERALS) TABS tablet Take 1 tablet by mouth daily.   Yes Historical Provider, MD  Omega-3 Fatty Acids (FISH OIL) 1000 MG CAPS Take 1 capsule by mouth daily.   Yes Historical Provider, MD  oxyCODONE-acetaminophen (PERCOCET/ROXICET) 5-325 MG per tablet Take 1-2 tablets by mouth every 6 (six) hours as needed for severe  pain. Patient taking differently: Take 1-2 tablets by mouth every 6 (six) hours as needed for severe pain (kidney stones).  07/14/14  Yes Jerelyn Scott, MD  tamsulosin (FLOMAX) 0.4 MG CAPS capsule Take 0.4 mg by mouth daily.   Yes Historical Provider, MD  gabapentin (NEURONTIN) 300 MG capsule Take 1 capsule (300 mg total) by mouth 3 (three) times daily. Patient taking differently: Take 300 mg by mouth 3  (three) times daily as needed (pain).  07/16/14   Drema Dallas, DO  ondansetron (ZOFRAN) 4 MG tablet Take 1 tablet (4 mg total) by mouth every 6 (six) hours. 07/14/14   Jerelyn Scott, MD   BP 119/79 mmHg  Pulse 68  Temp(Src) 98.2 F (36.8 C) (Oral)  Resp 15  Ht  (1.778 m)  Wt 217 lb (98.431 kg)  BMI 31.14 kg/m2  SpO2 96% Physical Exam  Constitutional: He is oriented to person, place, and time. He appears well-developed.  HENT:  Head: Normocephalic.  Eyes: Conjunctivae and EOM are normal. No scleral icterus.  Neck: Neck supple. No thyromegaly present.  Cardiovascular: Normal rate and regular rhythm.  Exam reveals no gallop and no friction rub.   No murmur heard. Pulmonary/Chest: No stridor. He has no wheezes. He has no rales. He exhibits no tenderness.  Abdominal: He exhibits no distension. There is no tenderness. There is no rebound.  Musculoskeletal: Normal range of motion. He exhibits no edema.  Lymphadenopathy:    He has no cervical adenopathy.  Neurological: He is oriented to person, place, and time. He exhibits normal muscle tone. Coordination normal.  Skin: No rash noted. No erythema.  Psychiatric: He has a normal mood and affect. His behavior is normal.    ED Course  Procedures (including critical care time) Labs Review Labs Reviewed  CBC WITH DIFFERENTIAL/PLATELET - Abnormal; Notable for the following:    WBC 11.7 (*)    Hemoglobin 12.7 (*)    HCT 36.7 (*)    Neutro Abs 9.0 (*)    All other components within normal limits  COMPREHENSIVE METABOLIC PANEL - Abnormal; Notable for the following:    Glucose, Bld 138 (*)    All other components within normal limits  D-DIMER, QUANTITATIVE (NOT AT [facility]) - Abnormal; Notable for the following:    D-Dimer, Quant 1.22 (*)    All other components within normal limits  URINALYSIS, ROUTINE W REFLEX MICROSCOPIC (NOT AT [facility])    Imaging Review Dg Chest Port 1 View  11/13/2014   CLINICAL DATA:  Status post left nephrostomy  tube removal. Shortness of breath starting a few hours ago.  EXAM: PORTABLE CHEST 1 VIEW  COMPARISON:  November 07, 2014  FINDINGS: The heart size and mediastinal contours are within normal limits. The aorta is tortuous. There is mild atelectasis of the left lung base. There is no focal infiltrate, pulmonary edema, or pleural effusion. The lung volumes are low. The visualized skeletal structures are stable.  IMPRESSION: Mild atelectasis of left lung base.  No focal pneumonia.   Electronically Signed   By: Sherian Rein M.D.   On: 11/13/2014 22:31   I have personally reviewed and evaluated these images and lab results as part of my medical decision-making.   EKG Interpretation None      MDM   Final diagnoses:  None    Patient with abdominal pain with inspiration starting today after he had left percutaneous nephrostomy tube removed. Patient has elevated d-dimer. We will get a CT angios of the  chest and also a CT of the abdomen.    Bethann Berkshire, MD 11/13/14 [phone]

## 2014-11-13 NOTE — ED Notes (Signed)
Bed: UE45 Expected date:  Expected time:  Means of arrival:  Comments: EMS stent removed, abdominal pain

## 2014-11-13 NOTE — ED Notes (Addendum)
Pt arrives to the ER via EMS from home with complaints of upper abd pain and left flank pain; pt states that he had his left nephrostomy tube removed this afternoon around 1630; pt states that he has been uncomfortable to the left flank area but tolerable; pt states that approx an hour ago he began to have increased pain to the left flank area and began to have spasms to upper abd area; pt states that it felt like his diaphragm was spasming; pt has not urinated since the stent was removed around 1630; pt denies feeling the urge to urinate; pt received a total of of Fentanyl en route via EMS and  Zofran; pt reported that the Fentanyl made him nauseous; pt also reports that he took Flexeril and Percocet around 1900; pt' [place] level was a 9 upon EMS arrival and upon arrival to ER pt reported his pain level to a 7.

## 2014-11-14 ENCOUNTER — Encounter (HOSPITAL_COMMUNITY): Payer: Self-pay | Admitting: Internal Medicine

## 2014-11-14 ENCOUNTER — Other Ambulatory Visit: Payer: Self-pay

## 2014-11-14 DIAGNOSIS — Y95 Nosocomial condition: Secondary | ICD-10-CM | POA: Diagnosis present

## 2014-11-14 DIAGNOSIS — Z833 Family history of diabetes mellitus: Secondary | ICD-10-CM | POA: Diagnosis not present

## 2014-11-14 DIAGNOSIS — R079 Chest pain, unspecified: Secondary | ICD-10-CM | POA: Diagnosis not present

## 2014-11-14 DIAGNOSIS — J189 Pneumonia, unspecified organism: Secondary | ICD-10-CM | POA: Diagnosis not present

## 2014-11-14 DIAGNOSIS — Z809 Family history of malignant neoplasm, unspecified: Secondary | ICD-10-CM | POA: Diagnosis not present

## 2014-11-14 DIAGNOSIS — R109 Unspecified abdominal pain: Secondary | ICD-10-CM | POA: Diagnosis present

## 2014-11-14 DIAGNOSIS — N200 Calculus of kidney: Secondary | ICD-10-CM | POA: Diagnosis not present

## 2014-11-14 DIAGNOSIS — D500 Iron deficiency anemia secondary to blood loss (chronic): Secondary | ICD-10-CM | POA: Diagnosis present

## 2014-11-14 DIAGNOSIS — N189 Chronic kidney disease, unspecified: Secondary | ICD-10-CM | POA: Diagnosis present

## 2014-11-14 LAB — URINALYSIS, ROUTINE W REFLEX MICROSCOPIC
BILIRUBIN URINE: NEGATIVE
GLUCOSE, UA: NEGATIVE mg/dL
Ketones, ur: NEGATIVE mg/dL
Nitrite: NEGATIVE
PROTEIN: 100 mg/dL — AB
SPECIFIC GRAVITY, URINE: 1.026 (ref 1.005–1.030)
Urobilinogen, UA: 0.2 mg/dL (ref 0.0–1.0)
pH: 5 (ref 5.0–8.0)

## 2014-11-14 LAB — LACTIC ACID, PLASMA
LACTIC ACID, VENOUS: 0.8 mmol/L (ref 0.5–2.0)
Lactic Acid, Venous: 1.1 mmol/L (ref 0.5–2.0)

## 2014-11-14 LAB — TROPONIN I

## 2014-11-14 LAB — URINE MICROSCOPIC-ADD ON

## 2014-11-14 LAB — PROCALCITONIN: Procalcitonin: <0.1 ng/mL

## 2014-11-14 LAB — PROTIME-INR
INR: 1.09 (ref 0.00–1.49)
PROTHROMBIN TIME: 14.3 s (ref 11.6–15.2)

## 2014-11-14 LAB — APTT: aPTT: 24 seconds (ref 24–37)

## 2014-11-14 LAB — STREP PNEUMONIAE URINARY ANTIGEN: STREP PNEUMO URINARY ANTIGEN: NEGATIVE

## 2014-11-14 MED ORDER — SODIUM CHLORIDE 0.9 % IV BOLUS (SEPSIS)
1000.0000 mL | Freq: Once | INTRAVENOUS | Status: AC
  Administered 2014-11-14: 1000 mL via INTRAVENOUS

## 2014-11-14 MED ORDER — DIPHENHYDRAMINE HCL 50 MG PO CAPS
50.0000 mg | ORAL_CAPSULE | ORAL | Status: DC | PRN
  Administered 2014-11-14 (×2): 50 mg via ORAL
  Filled 2014-11-14 (×2): qty 1

## 2014-11-14 MED ORDER — ALBUTEROL SULFATE (2.5 MG/3ML) 0.083% IN NEBU: 2.5000 mg | INHALATION_SOLUTION | RESPIRATORY_TRACT | Status: DC | PRN

## 2014-11-14 MED ORDER — GABAPENTIN 300 MG PO CAPS: 300.0000 mg | ORAL_CAPSULE | Freq: Three times a day (TID) | ORAL | Status: DC | PRN

## 2014-11-14 MED ORDER — PIPERACILLIN-TAZOBACTAM 3.375 G IVPB
3.3750 g | Freq: Three times a day (TID) | INTRAVENOUS | Status: DC
  Administered 2014-11-14 – 2014-11-15 (×4): 3.375 g via INTRAVENOUS
  Filled 2014-11-14 (×4): qty 50

## 2014-11-14 MED ORDER — DM-GUAIFENESIN ER 30-600 MG PO TB12: 1.0000 | ORAL_TABLET | Freq: Two times a day (BID) | ORAL | Status: DC | PRN

## 2014-11-14 MED ORDER — CYCLOBENZAPRINE HCL 10 MG PO TABS
10.0000 mg | ORAL_TABLET | Freq: Three times a day (TID) | ORAL | Status: DC | PRN
Start: 2014-11-14 — End: 2014-11-15

## 2014-11-14 MED ORDER — POLYETHYLENE GLYCOL 3350 17 G PO PACK
17.0000 g | PACK | Freq: Every day | ORAL | Status: DC | PRN
  Administered 2014-11-14: 17 g via ORAL
  Filled 2014-11-14: qty 1

## 2014-11-14 MED ORDER — VANCOMYCIN HCL IN DEXTROSE 1-5 GM/200ML-% IV SOLN
1000.0000 mg | Freq: Once | INTRAVENOUS | Status: AC
  Administered 2014-11-14: 1000 mg via INTRAVENOUS
  Filled 2014-11-14: qty 200

## 2014-11-14 MED ORDER — SODIUM CHLORIDE 0.9 % IV SOLN
INTRAVENOUS | Status: DC
  Administered 2014-11-14: 03:00:00 via INTRAVENOUS

## 2014-11-14 MED ORDER — TAMSULOSIN HCL 0.4 MG PO CAPS
0.4000 mg | ORAL_CAPSULE | Freq: Every day | ORAL | Status: DC
  Administered 2014-11-14 – 2014-11-15 (×2): 0.4 mg via ORAL
  Filled 2014-11-14 (×2): qty 1

## 2014-11-14 MED ORDER — OMEGA-3-ACID ETHYL ESTERS 1 G PO CAPS
1.0000 g | ORAL_CAPSULE | Freq: Every day | ORAL | Status: DC
  Administered 2014-11-14 – 2014-11-15 (×2): 1 g via ORAL
  Filled 2014-11-14 (×2): qty 1

## 2014-11-14 MED ORDER — SACCHAROMYCES BOULARDII 250 MG PO CAPS
250.0000 mg | ORAL_CAPSULE | Freq: Two times a day (BID) | ORAL | Status: DC
  Administered 2014-11-14 – 2014-11-15 (×3): 250 mg via ORAL
  Filled 2014-11-14 (×3): qty 1

## 2014-11-14 MED ORDER — ONDANSETRON HCL 4 MG/2ML IJ SOLN: 4.0000 mg | Freq: Three times a day (TID) | INTRAMUSCULAR | Status: DC | PRN

## 2014-11-14 MED ORDER — OXYCODONE-ACETAMINOPHEN 5-325 MG PO TABS
2.0000 | ORAL_TABLET | ORAL | Status: DC | PRN
  Administered 2014-11-14 (×3): 2 via ORAL
  Filled 2014-11-14 (×3): qty 2

## 2014-11-14 MED ORDER — ADULT MULTIVITAMIN W/MINERALS CH
1.0000 | ORAL_TABLET | Freq: Every day | ORAL | Status: DC
  Administered 2014-11-14 – 2014-11-15 (×2): 1 via ORAL
  Filled 2014-11-14 (×2): qty 1

## 2014-11-14 MED ORDER — SENNOSIDES-DOCUSATE SODIUM 8.6-50 MG PO TABS
2.0000 | ORAL_TABLET | Freq: Two times a day (BID) | ORAL | Status: DC
  Administered 2014-11-14 (×2): 2 via ORAL
  Filled 2014-11-14 (×3): qty 2

## 2014-11-14 MED ORDER — VANCOMYCIN HCL IN DEXTROSE 1-5 GM/200ML-% IV SOLN
1000.0000 mg | Freq: Three times a day (TID) | INTRAVENOUS | Status: DC
  Administered 2014-11-14 – 2014-11-15 (×4): 1000 mg via INTRAVENOUS
  Filled 2014-11-14 (×3): qty 200

## 2014-11-14 MED ORDER — PIPERACILLIN-TAZOBACTAM 3.375 G IVPB 30 MIN
3.3750 g | Freq: Once | INTRAVENOUS | Status: AC
  Administered 2014-11-14: 3.375 g via INTRAVENOUS
  Filled 2014-11-14: qty 50

## 2014-11-14 MED ORDER — DIPHENHYDRAMINE HCL 50 MG/ML IJ SOLN: 25.0000 mg | Freq: Four times a day (QID) | INTRAMUSCULAR | Status: DC | PRN

## 2014-11-14 MED ORDER — HYDROMORPHONE HCL 1 MG/ML IJ SOLN
1.0000 mg | INTRAMUSCULAR | Status: DC | PRN
  Administered 2014-11-14: 1 mg via INTRAVENOUS
  Filled 2014-11-14: qty 1

## 2014-11-14 NOTE — Progress Notes (Signed)
Mitchell Dougherty is a 44 y.o. male with PMH of chronic low-back pain, right trigeminal neuralgia, kidney stone, s/p of left percutaneous nephrolithotomy, who present with chest pain, left flank pain, spasm over left upper abdomen. CT chest revealed bilateral basilar pneumonia. He was started on broad spectrum antibiotics.  Monitor.   Kathlen Mody. [phone]

## 2014-11-14 NOTE — Progress Notes (Signed)
ANTIBIOTIC CONSULT NOTE - INITIAL  Pharmacy Consult for Zosyn/Vancomycin Indication: HCAP  No Known Allergies  Patient Measurements: Height:  (177.8 cm) Weight: 217 lb (98.431 kg) IBW/kg (Calculated) : 73   Vital Signs: Temp: 98.2 F (36.8 C) (09/28 2206) Temp Source: Oral (09/28 2206) BP: 122/78 mmHg (09/29 0203) Pulse Rate: 70 (09/29 0203) Intake/Output from previous day:   Intake/Output from this shift:    Labs:  Recent Labs  11/13/14 2215  WBC 11.7*  HGB 12.7*  PLT 294  CREATININE 1.12   Estimated Creatinine Clearance: 97 mL/min (by C-G formula based on Cr of 1.12). No results for input(s): VANCOTROUGH, VANCOPEAK, VANCORANDOM, GENTTROUGH, GENTPEAK, GENTRANDOM, TOBRATROUGH, TOBRAPEAK, TOBRARND, AMIKACINPEAK, AMIKACINTROU, AMIKACIN in the last 72 hours.   Microbiology: No results found for this or any previous visit (from the past 720 hour(s)).  Medical History: Past Medical History  Diagnosis Date  . Back pain   . Chronic kidney disease   . Trigeminal neuralgia   . Renal stone     Medications:   (Not in a hospital admission) Scheduled:  . multivitamin with minerals  1 tablet Oral Daily  . omega-3 acid ethyl esters  1 g Oral Daily  . tamsulosin  0.4 mg Oral Daily   Infusions:  . sodium chloride    . piperacillin-tazobactam    . sodium chloride    . vancomycin     Assessment: 44 yoM noted to have left sided basilar PNA.  S/p recent hospitalization for urology procedure.  Zosyn/Vancomycin per Rx for HCAP.  Goal of Therapy:  Vancomycin trough level 15-20 mcg/ml  Plan:   Zosyn 3.375 Gm IV q8h EI  Vancomycin 1Gm IV q8h  F/u SCr/cultures/levels as needed  Susanne Greenhouse R 11/14/2014,2:34 AM

## 2014-11-14 NOTE — ED Provider Notes (Signed)
Appears to have left sided basilar pneumonia. Mild elevation in white blood cell count. Vital signs stable. Given her recent hospital admission with treat for hospital-acquired pneumonia. Discussed with Dr.Niu. We'll see patient in the emergency department and admit.  Mitchell Racer, MD 11/14/14 [phone]

## 2014-11-14 NOTE — H&P (Signed)
[facility] History and Physical  Mitchell Dougherty ZOX:[id number] DOB: 07-21-1970 DOA: 11/13/2014  Referring physician: ED physician PCP: Johny Blamer, MD  Specialists:   Chief Complaint: left flank pain, spasm over left upper abdomen  HPI: Mitchell Dougherty is a 44 y.o. 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 He was recently hospitalized from  9/22 to 9/24 because of kidney stone. He is s/p of left percutaneous nephrolithotomy by dr. Retta Diones. Pt states that he had his left nephrostomy tube removed this afternoon around 1630. Initially, he has been uncomfortable to the left flank area but tolerable. Later on, he began to have increased pain to the left flank area. He also began to have spasms to left upper abd area. He states that it felt like his diaphragm was spasming, which is aggravated by deep breathing. Pt has not urinated since the stent was removed around 1630. He does not have symptoms of UTI. He does not have chest pain, shortness of breath, cough, fever or chills. No rashes or unilateral weakness. No tenderness over the calf area.  In ED, patient was found to have WBC 11.7, urinalysis positive with trace amount of leukocytes and hematuria, temperature normal, no tachycardia, electrolytes okay. CTA of chest and CT-abb/pelvise showed no pulmonary embolus, low lung volumes with bilateral lower lobe airspace opacities; sequela of recent left percutaneous nephrostomy, mild urothelial thickening involving the left proximal ureter and renal pelvis may be related to recent procedure, can not exclude urinary tract infection. There are tiny residual stone fragments in the left kidney. No recurrent hydronephrosis. No fluid collection along the nephrostomy tract.   Where does patient live?   At  home    Can patient participate in ADLs?  Yes     Review of Systems:   General: no fevers, chills, no changes in body weight, has fatigue HEENT: no blurry vision, hearing changes or sore throat Pulm: no dyspnea, coughing, wheezing CV: no chest pain, palpitations Abd: no nausea, vomiting, abdominal pain, diarrhea, constipation. Has abdominal spasm.  GU: no dysuria, burning on urination, increased urinary frequency, has hematuria. Has L flank pain. Ext: no leg edema Neuro: no unilateral weakness, numbness, or tingling, no vision change or hearing loss Skin: no rash MSK: No muscle spasm, no deformity, no limitation of range of movement in spin Heme: No easy bruising.  Travel history: No recent long distant travel.  Allergy: No Known Allergies  Past Medical History  Diagnosis Date  . Back pain   . Chronic kidney disease   . Trigeminal neuralgia   . Renal stone     Past Surgical History  Procedure Laterality Date  . Lithrotripsy    . Percutaneous nephrostomy    . Wisdom tooth extraction  2005  . Nephrolithotomy Left 11/07/2014    Procedure: NEPHROLITHOTOMY PERCUTANEOUS;  Surgeon: Marcine Matar, MD;  Location: [facility];  Service: Urology;  Laterality: Left;    Social History:  reports that he has never smoked. He has never used smokeless tobacco. He reports that he does not drink alcohol or use illicit drugs.  Family History:  Family History  Problem Relation Age of Onset  . Cancer Mother     colon   . Kidney Stones Father   . Cancer Father     renal cell   . Cancer Sister  unknown   . Nephrolithiasis Brother   . Diabetes Maternal Grandmother      Prior to Admission medications   Medication Sig Start Date End Date Taking? Authorizing Provider  cephALEXin (KEFLEX) 500 MG capsule Take 1 capsule (500 mg total) by mouth 2 (two) times daily. 11/08/14  Yes Marcine Matar, MD  cyclobenzaprine (FLEXERIL) 10 MG tablet Take 10 mg by mouth 3 (three) times daily as needed  for muscle spasms.   Yes Historical Provider, MD  Multiple Vitamin (MULTIVITAMIN WITH MINERALS) TABS tablet Take 1 tablet by mouth daily.   Yes Historical Provider, MD  Omega-3 Fatty Acids (FISH OIL) 1000 MG CAPS Take 1 capsule by mouth daily.   Yes Historical Provider, MD  oxyCODONE-acetaminophen (PERCOCET/ROXICET) 5-325 MG per tablet Take 1-2 tablets by mouth every 6 (six) hours as needed for severe pain. Patient taking differently: Take 1-2 tablets by mouth every 6 (six) hours as needed for severe pain (kidney stones).  07/14/14  Yes Jerelyn Scott, MD  tamsulosin (FLOMAX) 0.4 MG CAPS capsule Take 0.4 mg by mouth daily.   Yes Historical Provider, MD  gabapentin (NEURONTIN) 300 MG capsule Take 1 capsule (300 mg total) by mouth 3 (three) times daily. Patient taking differently: Take 300 mg by mouth 3 (three) times daily as needed (pain).  07/16/14   Drema Dallas, DO  ondansetron (ZOFRAN) 4 MG tablet Take 1 tablet (4 mg total) by mouth every 6 (six) hours. 07/14/14   Jerelyn Scott, MD    Physical Exam: Filed Vitals:   11/13/14 2330 11/14/14 0202 11/14/14 0203 11/14/14 0230  BP: 122/69 122/78 122/78 126/77  Pulse: 74 72 70 83  Temp:      TempSrc:      Resp: 15 14 18 14   Height:      Weight:      SpO2: 98% 97% 98% 98%   General: Not in acute distress HEENT:       Eyes: PERRL, EOMI, no scleral icterus.       ENT: No discharge from the ears and nose, no pharynx injection, no tonsillar enlargement.        Neck: No JVD, no bruit, no mass felt. Heme: No neck lymph node enlargement. Cardiac: S1/S2, RRR, No murmurs, No gallops or rubs. Pulm: No rales, wheezing, rhonchi or rubs. Abd: Soft, nondistended, nontender, no rebound pain, no organomegaly, BS present. Ext: No pitting leg edema bilaterally. 2+DP/PT pulse bilaterally. GU: has L flank pain tenderness. The surgical site over L flank area is clean. Musculoskeletal: No joint deformities, No joint redness or warmth, no limitation of ROM in  spin. Skin: No rashes.  Neuro: Alert, oriented X3, cranial nerves II-XII grossly intact, muscle strength 5/5 in all extremities, sensation to light touch intact.  Psych: Patient is not psychotic, no suicidal or hemocidal ideation.  Labs on Admission:  Basic Metabolic Panel:  Recent Labs Lab 11/13/14 2215  NA 138  K 3.5  CL 104  CO2 24  GLUCOSE 138*  BUN 16  CREATININE 1.12  CALCIUM 9.0   Liver Function Tests:  Recent Labs Lab 11/13/14 2215  AST 20  ALT 22  ALKPHOS 48  BILITOT 0.4  PROT 7.3  ALBUMIN 4.5   No results for input(s): LIPASE, AMYLASE in the last 168 hours. No results for input(s): AMMONIA in the last 168 hours. CBC:  Recent Labs Lab 11/08/14 0522 11/13/14 2215  WBC  --  11.7*  NEUTROABS  --  9.0*  HGB 12.8* 12.7*  HCT  38.3* 36.7*  MCV  --  85.5  PLT  --  294   Cardiac Enzymes: No results for input(s): CKTOTAL, CKMB, CKMBINDEX, TROPONINI in the last 168 hours.  BNP (last 3 results) No results for input(s): BNP in the last 8760 hours.  ProBNP (last 3 results) No results for input(s): PROBNP in the last 8760 hours.  CBG: No results for input(s): GLUCAP in the last 168 hours.  Radiological Exams on Admission: Ct Angio Chest Pe W/cm &/or Wo Cm  11/14/2014   CLINICAL DATA:  Upper abdominal pain with inspiration, elevated D-dimer. Removal of left percutaneous nephrostomy tube today.  EXAM: CT ANGIOGRAPHY CHEST  CT ABDOMEN AND PELVIS WITH CONTRAST  TECHNIQUE: Multidetector CT imaging of the chest was performed using the standard protocol during bolus administration of intravenous contrast. Multiplanar CT image reconstructions and MIPs were obtained to evaluate the vascular anatomy. Multidetector CT imaging of the abdomen and pelvis was performed using the standard protocol during bolus administration of intravenous contrast.  CONTRAST:  25mL OMNIPAQUE IOHEXOL 300 MG/ML SOLN, OMNIPAQUE IOHEXOL 350 MG/ML SOLN  COMPARISON:  Chest radiograph earlier  this day. CT abdomen/ pelvis 06/26/2014  FINDINGS: CTA CHEST FINDINGS  There are no filling defects within the pulmonary arteries to suggest pulmonary embolus.  The thoracic aorta is normal in caliber. Conventional branching pattern from the aortic arch. The heart is normal in size. No pericardial effusion. No mediastinal or hilar adenopathy.  Bilateral lower lobe opacities, left greater than right. The upper lungs are clear. There is a small left pleural effusion. No right pleural effusion.  There are no acute or suspicious osseous abnormalities.  CT ABDOMEN and PELVIS FINDINGS  Sequela of prior left percutaneous nephrostomy with minimal tract induration extending from the skin posteriorly to the lateral left kidney. No significant fluid collection or abscess along the course of the track. No recurrent left hydronephrosis. Tiny focus of air in the left renal pelvis, consistent with recent procedure. There is mild left urothelial thickening involving the proximal ureter and renal pelvis. Small focus of air seen in the distal left ureter. There is symmetric excretion on delayed phase imaging. Two small 2 small stone fragments in the interpolar left kidney measure 2-3 mm. Punctate stone fragments in the lower left kidney, 2 mm. There is a 4 cm cyst in the mid upper left kidney.  The liver, spleen, gallbladder, pancreas, and adrenal glands are normal.  Stomach is physiologically distended with ingested contents. There are no dilated or thickened bowel loops. The appendix is normal small to moderate stool burden without colonic wall thickening.  No free air, free fluid, or intra-abdominal fluid collection. Mild haziness in the mesentery with small lymph nodes, unchanged from prior exam.  No retroperitoneal adenopathy. Abdominal aorta is normal in caliber.  Within the pelvis the bladder is physiologically distended, no bladder wall thickening or intravesicular air. Prostate gland is prominent in size. No pelvic free  fluid. Pelvic phleboliths are seen.  There are no acute or suspicious osseous abnormalities.  Review of the MIP images confirms the above findings.  IMPRESSION: 1. No pulmonary embolus. 2. Low lung volumes with bilateral lower lobe airspace opacities. On the right this likely represents atelectasis. On the left, pneumonia superimposed on atelectasis is considered. 3. Sequela of recent left percutaneous nephrostomy. Mild urothelial thickening involving the left proximal ureter and renal pelvis may be related to recent procedure, however correlation with urinalysis recommended to exclude urinary tract infection. There are tiny residual stone fragments  in the left kidney. No recurrent hydronephrosis. No fluid collection along the nephrostomy tract. 4. Otherwise no acute abnormality in the abdomen/pelvis.   Electronically Signed   By: Rubye Oaks M.D.   On: 11/14/2014 00:48   Ct Abdomen Pelvis W Contrast  11/14/2014   CLINICAL DATA:  Upper abdominal pain with inspiration, elevated D-dimer. Removal of left percutaneous nephrostomy tube today.  EXAM: CT ANGIOGRAPHY CHEST  CT ABDOMEN AND PELVIS WITH CONTRAST  TECHNIQUE: Multidetector CT imaging of the chest was performed using the standard protocol during bolus administration of intravenous contrast. Multiplanar CT image reconstructions and MIPs were obtained to evaluate the vascular anatomy. Multidetector CT imaging of the abdomen and pelvis was performed using the standard protocol during bolus administration of intravenous contrast.  CONTRAST:  25mL OMNIPAQUE IOHEXOL 300 MG/ML SOLN, OMNIPAQUE IOHEXOL 350 MG/ML SOLN  COMPARISON:  Chest radiograph earlier this day. CT abdomen/ pelvis 06/26/2014  FINDINGS: CTA CHEST FINDINGS  There are no filling defects within the pulmonary arteries to suggest pulmonary embolus.  The thoracic aorta is normal in caliber. Conventional branching pattern from the aortic arch. The heart is normal in size. No pericardial effusion.  No mediastinal or hilar adenopathy.  Bilateral lower lobe opacities, left greater than right. The upper lungs are clear. There is a small left pleural effusion. No right pleural effusion.  There are no acute or suspicious osseous abnormalities.  CT ABDOMEN and PELVIS FINDINGS  Sequela of prior left percutaneous nephrostomy with minimal tract induration extending from the skin posteriorly to the lateral left kidney. No significant fluid collection or abscess along the course of the track. No recurrent left hydronephrosis. Tiny focus of air in the left renal pelvis, consistent with recent procedure. There is mild left urothelial thickening involving the proximal ureter and renal pelvis. Small focus of air seen in the distal left ureter. There is symmetric excretion on delayed phase imaging. Two small 2 small stone fragments in the interpolar left kidney measure 2-3 mm. Punctate stone fragments in the lower left kidney, 2 mm. There is a 4 cm cyst in the mid upper left kidney.  The liver, spleen, gallbladder, pancreas, and adrenal glands are normal.  Stomach is physiologically distended with ingested contents. There are no dilated or thickened bowel loops. The appendix is normal small to moderate stool burden without colonic wall thickening.  No free air, free fluid, or intra-abdominal fluid collection. Mild haziness in the mesentery with small lymph nodes, unchanged from prior exam.  No retroperitoneal adenopathy. Abdominal aorta is normal in caliber.  Within the pelvis the bladder is physiologically distended, no bladder wall thickening or intravesicular air. Prostate gland is prominent in size. No pelvic free fluid. Pelvic phleboliths are seen.  There are no acute or suspicious osseous abnormalities.  Review of the MIP images confirms the above findings.  IMPRESSION: 1. No pulmonary embolus. 2. Low lung volumes with bilateral lower lobe airspace opacities. On the right this likely represents atelectasis. On the left,  pneumonia superimposed on atelectasis is considered. 3. Sequela of recent left percutaneous nephrostomy. Mild urothelial thickening involving the left proximal ureter and renal pelvis may be related to recent procedure, however correlation with urinalysis recommended to exclude urinary tract infection. There are tiny residual stone fragments in the left kidney. No recurrent hydronephrosis. No fluid collection along the nephrostomy tract. 4. Otherwise no acute abnormality in the abdomen/pelvis.   Electronically Signed   By: Rubye Oaks M.D.   On: 11/14/2014 00:48   Dg  Chest Port 1 View  11/13/2014   CLINICAL DATA:  Status post left nephrostomy tube removal. Shortness of breath starting a few hours ago.  EXAM: PORTABLE CHEST 1 VIEW  COMPARISON:  November 07, 2014  FINDINGS: The heart size and mediastinal contours are within normal limits. The aorta is tortuous. There is mild atelectasis of the left lung base. There is no focal infiltrate, pulmonary edema, or pleural effusion. The lung volumes are low. The visualized skeletal structures are stable.  IMPRESSION: Mild atelectasis of left lung base.  No focal pneumonia.   Electronically Signed   By: Sherian Rein M.D.   On: 11/13/2014 22:31    EKG: Not done in ED, will get one.   Assessment/Plan Principal Problem:   HCAP (healthcare-associated pneumonia) Active Problems:   Nephrolithiasis   Abdominal spasms  HCAP: Patient does not have symptoms of pneumonia, no cough, shortness of breath or difficult breathing, CT scan showed possible lower lobe pneumonia. He has leukocytosis which is consistent with infection. Patient is not septic on admission, but lactate level is pending.  - Will admit to Telemetry Bed - IV Vancomycin and Zosyn - Prn Mucinex for cough  - Prn albuterol if develops SOB - Urine legionella and S. pneumococcal antigen - Follow up blood culture x 2, sputum culture - will get Procalcitonin and trend lactic acid level  - IVF: 2L  of NS bolus in ED, followed by 125 mL per hour of NS   Abdominal spasm: Etiology is not clear. Likely due to diaphragm irritation from lower lobe pneumonia or recent left nephrostomy procedure.  -On flexeril   Nephrolithiasis: s/p of left percutaneous nephrolithotomy by dr. Retta Diones. Left nephrostomy tube was removed today. CT-abd/pelvis has no acute new issues. -Pain control: prn Percocet and Dilaudid. Urinalysis showed trace amount of leukocytes, indicating possible mild UTI -When necessary Zofran for nausea -May let Dr. Retta Diones know his admission in AM. -follow up urine culure -On IV abx as above.   DVT ppx: SCD  Code Status: Full code Family Communication: Yes, patient's wife at bed side Disposition Plan: Admit to inpatient   Date of Service 11/14/2014    Lorretta Harp [facility] Pager [phone]  If 7PM-7AM, please contact night-coverage [email or web address] Password [email or web address] 11/14/2014, 2:56 AM

## 2014-11-15 DIAGNOSIS — N200 Calculus of kidney: Secondary | ICD-10-CM

## 2014-11-15 DIAGNOSIS — J189 Pneumonia, unspecified organism: Principal | ICD-10-CM

## 2014-11-15 LAB — CBC WITH DIFFERENTIAL/PLATELET
BASOS PCT: 1 %
Basophils Absolute: 0.1 10*3/uL (ref 0.0–0.1)
Eosinophils Absolute: 0.4 10*3/uL (ref 0.0–0.7)
Eosinophils Relative: 6 %
HEMATOCRIT: 35.5 % — AB (ref 39.0–52.0)
HEMOGLOBIN: 11.9 g/dL — AB (ref 13.0–17.0)
LYMPHS ABS: 2.3 10*3/uL (ref 0.7–4.0)
Lymphocytes Relative: 31 %
MCH: 29.3 pg (ref 26.0–34.0)
MCHC: 33.5 g/dL (ref 30.0–36.0)
MCV: 87.4 fL (ref 78.0–100.0)
MONO ABS: 0.7 10*3/uL (ref 0.1–1.0)
MONOS PCT: 9 %
NEUTROS ABS: 3.9 10*3/uL (ref 1.7–7.7)
NEUTROS PCT: 53 %
Platelets: 318 10*3/uL (ref 150–400)
RBC: 4.06 MIL/uL — ABNORMAL LOW (ref 4.22–5.81)
RDW: 13.5 % (ref 11.5–15.5)
WBC: 7.4 10*3/uL (ref 4.0–10.5)

## 2014-11-15 LAB — BASIC METABOLIC PANEL
ANION GAP: 6 (ref 5–15)
BUN: 8 mg/dL (ref 6–20)
CALCIUM: 8.9 mg/dL (ref 8.9–10.3)
CHLORIDE: 106 mmol/L (ref 101–111)
CO2: 27 mmol/L (ref 22–32)
Creatinine, Ser: 0.94 mg/dL (ref 0.61–1.24)
GFR calc non Af Amer: >60 mL/min (ref >60)
Glucose, Bld: 74 mg/dL (ref 65–99)
Potassium: 4.2 mmol/L (ref 3.5–5.1)
SODIUM: 139 mmol/L (ref 135–145)

## 2014-11-15 LAB — VITAMIN B12: VITAMIN B 12: 374 pg/mL (ref 180–914)

## 2014-11-15 LAB — FOLATE: FOLATE: 22.4 ng/mL (ref >5.9)

## 2014-11-15 LAB — IRON AND TIBC
IRON: 52 ug/dL (ref 45–182)
Saturation Ratios: 20 % (ref 17.9–39.5)
TIBC: 266 ug/dL (ref 250–450)
UIBC: 214 ug/dL

## 2014-11-15 LAB — URINE CULTURE: CULTURE: NO GROWTH

## 2014-11-15 LAB — FERRITIN: Ferritin: 141 ng/mL (ref 24–336)

## 2014-11-15 MED ORDER — LEVOFLOXACIN 750 MG PO TABS: 750.0000 mg | ORAL_TABLET | Freq: Every day | ORAL | Status: AC

## 2014-11-15 MED ORDER — SACCHAROMYCES BOULARDII 250 MG PO CAPS: 250.0000 mg | ORAL_CAPSULE | Freq: Two times a day (BID) | ORAL | Status: AC

## 2014-11-15 NOTE — Progress Notes (Signed)
ANTIBIOTIC CONSULT NOTE - FOLLOW UP  Pharmacy Consult for Vancomycin, Zosyn Indication: HAP  No Known Allergies  Patient Measurements: Height:  (177.8 cm) Weight: 220 lb 8 oz (100.018 kg) IBW/kg (Calculated) : 73  Vital Signs: Temp: 98.3 F (36.8 C) (09/30 0601) Temp Source: Oral (09/30 0601) BP: 110/63 mmHg (09/30 0601) Pulse Rate: 64 (09/30 0601) Intake/Output from previous day: 09/29 0701 - 09/30 0700 In: 1180 [P.O.:480; IV Piggyback:700] Out: -   Labs:  Recent Labs  11/13/14 2215  WBC 11.7*  HGB 12.7*  PLT 294  CREATININE 1.12   Estimated Creatinine Clearance: 97.7 mL/min (by C-G formula based on Cr of 1.12). No results for input(s): VANCOTROUGH, VANCOPEAK, VANCORANDOM, GENTTROUGH, GENTPEAK, GENTRANDOM, TOBRATROUGH, TOBRAPEAK, TOBRARND, AMIKACINPEAK, AMIKACINTROU, AMIKACIN in the last 72 hours.   Assessment: 20 yoM presented to ED on 9/28 with flank and abdominal pain.  Recent left percutaneous nephrolithotomy on 9/22 and nephrostomy tube removed in urology office on 9/28 with subsequent development of pain.  Pt had CT for elevated D-dimer that r/o PE, but showed possible LL pneumonia.  9/29 >>zosyn >> 9/29 >>vancomycin >>   Today, 11/15/2014: Afebrile WBC WNL SCr 0.94 Vancomycin trough level pending Blood and urine cultures in progress.  Strep pneumo Ag negative.  Goal of Therapy:  Vancomycin trough level 15-20 mcg/ml  Plan:  Zosyn 3.375g IV Q8H infused over 4hrs.  Vancomycin 1g IV q8h. Measure Vanc trough at steady state. Follow up renal fxn, culture results, and clinical course.  Lynann Beaver PharmD, BCPS Pager [phone] 11/15/2014 9:48 AM

## 2014-11-15 NOTE — Discharge Summary (Signed)
Physician Discharge Summary  Mitchell Dougherty WJX:[id number] DOB: [birth year] DOA: 11/13/2014  PCP: Johny Blamer, MD  Admit date: 11/13/2014 Discharge date: 11/15/2014  Time spent: 30 minutes  Recommendations for Outpatient Follow-up:  1. Follow up with Dr Lynnae Sandhoff as recommended and please check a cbc at thetime of the visit.  2. May obtain a CXR to evaluate for resolution of the pneumonia in 4 weeks.   Discharge Diagnoses:  Principal Problem:   HCAP (healthcare-associated pneumonia) Active Problems:   Nephrolithiasis   Abdominal spasms   Discharge Condition: improved  Diet recommendation: regular  Filed Weights   11/13/14 2206 11/14/14 0258  Weight: 98.431 kg (217 lb) 100.018 kg (220 lb 8 oz)    History of present illness:  Mitchell Dougherty is a 44 y.o. male with PMH of chronic low-back pain, right trigeminal neuralgia, kidney stone, s/p of left percutaneous nephrolithotomy, who present with chest pain, left flank pain, spasm over left upper abdomen.  Hospital Course:  Health care associated pneumonia: Started on broad spectrum antibiotics, with IV vancomycin and ZOSYN. Blood cultures have been negative.  Normal lactic acid. Urine for strep and legionella antigen is negative. Completed 2 days of broad spectrum antibiotics, plan to discharge on oral antibiotics to complete the course.  Recommend checking CXR in 4 weeks for resolution of the pneumonia.    Pleuritic chest pain: resolved.   Nephrolithiasis: s/p left percutaneous nephrolithotomy, had 2 nephrostomy tubes placed , which were removed on 9/29. Repeat CT on admission did  Not reveal any new pathology. Pain well controlled.  Repeat urine cultures have been negative.  Anemia: Probably blood loss anemia from renal stone and the procedure.  Anemia panel shows normal counts of iron and ferritin.  Recommend checking hemoglobin in 2 weeks at office.   Procedures:  none  Consultations:  none  Discharge  Exam: Filed Vitals:   11/15/14 1308  BP: 128/76  Pulse: 85  Temp: 98.2 F (36.8 C)  Resp: 16    General: alert afebrile comfortable Cardiovascular: s1s2 Respiratory: ctab  Discharge Instructions   Discharge Instructions    Discharge instructions    Complete by:  As directed   Follow up with PCP IN 2 to 4 weeks, please check a 2 view CXR in 4 weeks.          Current Discharge Medication List    START taking these medications   Details  levofloxacin (LEVAQUIN) 750 MG tablet Take 1 tablet (750 mg total) by mouth daily. Qty: 5 tablet, Refills: 0    saccharomyces boulardii (FLORASTOR) 250 MG capsule Take 1 capsule (250 mg total) by mouth 2 (two) times daily. Qty: 14 capsule, Refills: 0      CONTINUE these medications which have NOT CHANGED   Details  cyclobenzaprine (FLEXERIL) 10 MG tablet Take 10 mg by mouth 3 (three) times daily as needed for muscle spasms.    Multiple Vitamin (MULTIVITAMIN WITH MINERALS) TABS tablet Take 1 tablet by mouth daily.    Omega-3 Fatty Acids (FISH OIL) 1000 MG CAPS Take 1 capsule by mouth daily.    oxyCODONE-acetaminophen (PERCOCET/ROXICET) 5-325 MG per tablet Take 1-2 tablets by mouth every 6 (six) hours as needed for severe pain. Qty: 20 tablet, Refills: 0    tamsulosin (FLOMAX) 0.4 MG CAPS capsule Take 0.4 mg by mouth daily.    gabapentin (NEURONTIN) 300 MG capsule Take 1 capsule (300 mg total) by mouth 3 (three) times daily. Qty: 90 capsule, Refills: 3    ondansetron (  ZOFRAN) 4 MG tablet Take 1 tablet (4 mg total) by mouth every 6 (six) hours. Qty: 12 tablet, Refills: 0      STOP taking these medications     cephALEXin (KEFLEX) 500 MG capsule        No Known Allergies Follow-up Information    Follow up with Johny Blamer, MD.   Specialty:  Family Medicine   Contact information:   [phone] [address] [place] [address] [phone]        The results of significant diagnostics from this hospitalization  (including imaging, microbiology, ancillary and laboratory) are listed below for reference.    Significant Diagnostic Studies: Dg Chest 1 View  11/07/2014   CLINICAL DATA:  Difficulty breathing.  EXAM: CHEST 1 VIEW  COMPARISON:  None.  FINDINGS: The heart size and mediastinal contours are within normal limits. Both lungs are clear. No pneumothorax or pleural effusion is noted. The visualized skeletal structures are unremarkable.  IMPRESSION: No acute cardiopulmonary abnormality seen.   Electronically Signed   By: Lupita Raider, M.D.   On: 11/07/2014 12:04   Ct Angio Chest Pe W/cm &/or Wo Cm  11/14/2014   CLINICAL DATA:  Upper abdominal pain with inspiration, elevated D-dimer. Removal of left percutaneous nephrostomy tube today.  EXAM: CT ANGIOGRAPHY CHEST  CT ABDOMEN AND PELVIS WITH CONTRAST  TECHNIQUE: Multidetector CT imaging of the chest was performed using the standard protocol during bolus administration of intravenous contrast. Multiplanar CT image reconstructions and MIPs were obtained to evaluate the vascular anatomy. Multidetector CT imaging of the abdomen and pelvis was performed using the standard protocol during bolus administration of intravenous contrast.  CONTRAST:  25mL OMNIPAQUE IOHEXOL 300 MG/ML SOLN, OMNIPAQUE IOHEXOL 350 MG/ML SOLN  COMPARISON:  Chest radiograph earlier this day. CT abdomen/ pelvis 06/26/2014  FINDINGS: CTA CHEST FINDINGS  There are no filling defects within the pulmonary arteries to suggest pulmonary embolus.  The thoracic aorta is normal in caliber. Conventional branching pattern from the aortic arch. The heart is normal in size. No pericardial effusion. No mediastinal or hilar adenopathy.  Bilateral lower lobe opacities, left greater than right. The upper lungs are clear. There is a small left pleural effusion. No right pleural effusion.  There are no acute or suspicious osseous abnormalities.  CT ABDOMEN and PELVIS FINDINGS  Sequela of prior left percutaneous  nephrostomy with minimal tract induration extending from the skin posteriorly to the lateral left kidney. No significant fluid collection or abscess along the course of the track. No recurrent left hydronephrosis. Tiny focus of air in the left renal pelvis, consistent with recent procedure. There is mild left urothelial thickening involving the proximal ureter and renal pelvis. Small focus of air seen in the distal left ureter. There is symmetric excretion on delayed phase imaging. Two small 2 small stone fragments in the interpolar left kidney measure 2-3 mm. Punctate stone fragments in the lower left kidney, 2 mm. There is a 4 cm cyst in the mid upper left kidney.  The liver, spleen, gallbladder, pancreas, and adrenal glands are normal.  Stomach is physiologically distended with ingested contents. There are no dilated or thickened bowel loops. The appendix is normal small to moderate stool burden without colonic wall thickening.  No free air, free fluid, or intra-abdominal fluid collection. Mild haziness in the mesentery with small lymph nodes, unchanged from prior exam.  No retroperitoneal adenopathy. Abdominal aorta is normal in caliber.  Within the pelvis the bladder is physiologically  distended, no bladder wall thickening or intravesicular air. Prostate gland is prominent in size. No pelvic free fluid. Pelvic phleboliths are seen.  There are no acute or suspicious osseous abnormalities.  Review of the MIP images confirms the above findings.  IMPRESSION: 1. No pulmonary embolus. 2. Low lung volumes with bilateral lower lobe airspace opacities. On the right this likely represents atelectasis. On the left, pneumonia superimposed on atelectasis is considered. 3. Sequela of recent left percutaneous nephrostomy. Mild urothelial thickening involving the left proximal ureter and renal pelvis may be related to recent procedure, however correlation with urinalysis recommended to exclude urinary tract infection. There  are tiny residual stone fragments in the left kidney. No recurrent hydronephrosis. No fluid collection along the nephrostomy tract. 4. Otherwise no acute abnormality in the abdomen/pelvis.   Electronically Signed   By: Rubye Oaks M.D.   On: 11/14/2014 00:48   Ct Abdomen Pelvis W Contrast  11/14/2014   CLINICAL DATA:  Upper abdominal pain with inspiration, elevated D-dimer. Removal of left percutaneous nephrostomy tube today.  EXAM: CT ANGIOGRAPHY CHEST  CT ABDOMEN AND PELVIS WITH CONTRAST  TECHNIQUE: Multidetector CT imaging of the chest was performed using the standard protocol during bolus administration of intravenous contrast. Multiplanar CT image reconstructions and MIPs were obtained to evaluate the vascular anatomy. Multidetector CT imaging of the abdomen and pelvis was performed using the standard protocol during bolus administration of intravenous contrast.  CONTRAST:  25mL OMNIPAQUE IOHEXOL 300 MG/ML SOLN, OMNIPAQUE IOHEXOL 350 MG/ML SOLN  COMPARISON:  Chest radiograph earlier this day. CT abdomen/ pelvis 06/26/2014  FINDINGS: CTA CHEST FINDINGS  There are no filling defects within the pulmonary arteries to suggest pulmonary embolus.  The thoracic aorta is normal in caliber. Conventional branching pattern from the aortic arch. The heart is normal in size. No pericardial effusion. No mediastinal or hilar adenopathy.  Bilateral lower lobe opacities, left greater than right. The upper lungs are clear. There is a small left pleural effusion. No right pleural effusion.  There are no acute or suspicious osseous abnormalities.  CT ABDOMEN and PELVIS FINDINGS  Sequela of prior left percutaneous nephrostomy with minimal tract induration extending from the skin posteriorly to the lateral left kidney. No significant fluid collection or abscess along the course of the track. No recurrent left hydronephrosis. Tiny focus of air in the left renal pelvis, consistent with recent procedure. There is mild left  urothelial thickening involving the proximal ureter and renal pelvis. Small focus of air seen in the distal left ureter. There is symmetric excretion on delayed phase imaging. Two small 2 small stone fragments in the interpolar left kidney measure 2-3 mm. Punctate stone fragments in the lower left kidney, 2 mm. There is a 4 cm cyst in the mid upper left kidney.  The liver, spleen, gallbladder, pancreas, and adrenal glands are normal.  Stomach is physiologically distended with ingested contents. There are no dilated or thickened bowel loops. The appendix is normal small to moderate stool burden without colonic wall thickening.  No free air, free fluid, or intra-abdominal fluid collection. Mild haziness in the mesentery with small lymph nodes, unchanged from prior exam.  No retroperitoneal adenopathy. Abdominal aorta is normal in caliber.  Within the pelvis the bladder is physiologically distended, no bladder wall thickening or intravesicular air. Prostate gland is prominent in size. No pelvic free fluid. Pelvic phleboliths are seen.  There are no acute or suspicious osseous abnormalities.  Review of the MIP images confirms the above findings.  IMPRESSION: 1. No pulmonary embolus. 2. Low lung volumes with bilateral lower lobe airspace opacities. On the right this likely represents atelectasis. On the left, pneumonia superimposed on atelectasis is considered. 3. Sequela of recent left percutaneous nephrostomy. Mild urothelial thickening involving the left proximal ureter and renal pelvis may be related to recent procedure, however correlation with urinalysis recommended to exclude urinary tract infection. There are tiny residual stone fragments in the left kidney. No recurrent hydronephrosis. No fluid collection along the nephrostomy tract. 4. Otherwise no acute abnormality in the abdomen/pelvis.   Electronically Signed   By: Rubye Oaks M.D.   On: 11/14/2014 00:48   Dg Chest Port 1 View  11/13/2014   CLINICAL  DATA:  Status post left nephrostomy tube removal. Shortness of breath starting a few hours ago.  EXAM: PORTABLE CHEST 1 VIEW  COMPARISON:  November 07, 2014  FINDINGS: The heart size and mediastinal contours are within normal limits. The aorta is tortuous. There is mild atelectasis of the left lung base. There is no focal infiltrate, pulmonary edema, or pleural effusion. The lung volumes are low. The visualized skeletal structures are stable.  IMPRESSION: Mild atelectasis of left lung base.  No focal pneumonia.   Electronically Signed   By: Sherian Rein M.D.   On: 11/13/2014 22:31   Dg C-arm Gt 120 Min-no Report  11/07/2014   CLINICAL DATA: surgery   C-ARM GT 120 MINUTE  Fluoroscopy was utilized by the requesting physician.  No radiographic  interpretation.    Ir Ureteral Stent Left New Access W/o Sep Nephrostomy Cath  11/07/2014   CLINICAL DATA:  44 year old with left renal stones and scheduled for left percutaneous nephrolithotomy procedure.  EXAM: PLACEMENT OF TWO ANTEGRADE NEPHROURETERAL CATHETERS WITH FLUOROSCOPIC GUIDANCE.  Physician: Rachelle Hora. Lowella Dandy, MD  FLUOROSCOPY TIME:  340 mGy, 23 minutes, 24 seconds  MEDICATIONS: 400 mg ciprofloxacin, 3 mg Versed, 100 mcg fentanyl. Insert moderate sedation A radiology nurse monitored the patient for moderate sedation.  ANESTHESIA/SEDATION: Moderate sedation time: 65 minutes  PROCEDURE: The procedure was explained to the patient. The risks and benefits of the procedure were discussed and the patient's questions were addressed. Informed consent was obtained from the patient. The left flank was marked prior to the procedure. The patient was placed prone on the interventional table. The left flank was prepped and draped in sterile fashion.  Stones in the left kidney lower pole region were identified with fluoroscopy. Left flank was anesthetized with 1% lidocaine. 22 gauge needle was directed towards these lower pole stones. Eventually, the needle was directed onto the  stones and contrast was used to opacify the renal collecting system. A 0.018 wire was advanced in the renal collecting system and an Accustick dilator set was placed. This catheter was exchanged for a 5 French catheter. Dr. Retta Diones had requested a mid or upper pole access for this procedure. As a result, attention was directed towards the upper/mid calyx. A 22 gauge needle was directed into this calyx with fluoroscopic guidance. A Glidewire was advanced into the ureter. An Accustick dilator set was placed and a upsized to a 5 [place] catheter which was advanced into the bladder. The lower pole 5 French catheter was advanced into the left ureter. Both catheters were sutured to the skin.  Patient had a short episode of bradycardia in the 40s, baseline heart rate in the 50s. Patient also had some nausea and a complained of pain with deep inspiration. The nausea and bradycardia with self-limiting. Following the  procedure, the patient had a chest x-ray which showed no evidence for pneumothorax. No acute findings identified on the chest radiograph. Patient was then transferred to the operating room.  FINDINGS: Multiple stones in the left kidney lower pole region. Access was obtained from a lower pole calyx and catheter was advanced into the right ureter. A second access was obtained from a upper/midpole calyx. This 5 French catheter was advanced into the urinary bladder.  Estimated blood loss: Minimal  COMPLICATIONS: Short episode of nausea and bradycardia. Patient's blood pressure remained stable during the short episode of bradycardia.  IMPRESSION: Placement of antegrade nephroureteral catheters in the left kidney for a percutaneous nephrolithotomy procedure. One catheter was placed in the lower pole and the other was placed in a upper/ mid calyx.   Electronically Signed   By: Richarda Overlie M.D.   On: 11/07/2014 17:42    Microbiology: Recent Results (from the past 240 hour(s))  Culture, blood (x 2)     Status: None  (Preliminary result)   Collection Time: 11/14/14  2:40 AM  Result Value Ref Range Status   Specimen Description BLOOD LEFT ANTECUBITAL  Final   Special Requests BOTTLES DRAWN AEROBIC AND ANAEROBIC 7CC  Final   Culture   Final    NO GROWTH 1 DAY Performed at [facility]    Report Status PENDING  Incomplete  Culture, blood (x 2)     Status: None (Preliminary result)   Collection Time: 11/14/14  2:48 AM  Result Value Ref Range Status   Specimen Description BLOOD LEFT HAND  Final   Special Requests BOTTLES DRAWN AEROBIC AND ANAEROBIC 10CC  Final   Culture   Final    NO GROWTH 1 DAY Performed at [facility]    Report Status PENDING  Incomplete  Urine culture     Status: None   Collection Time: 11/14/14  4:25 AM  Result Value Ref Range Status   Specimen Description URINE, CLEAN CATCH  Final   Special Requests NONE  Final   Culture   Final    NO GROWTH 1 DAY Performed at [facility]    Report Status 11/15/2014 FINAL  Final     Labs: Basic Metabolic Panel:  Recent Labs Lab 11/13/14 2215 11/15/14 1115  NA 138 139  K 3.5 4.2  CL 104 106  CO2 24 27  GLUCOSE 138* 74  BUN 16 8  CREATININE 1.12 0.94  CALCIUM 9.0 8.9   Liver Function Tests:  Recent Labs Lab 11/13/14 2215  AST 20  ALT 22  ALKPHOS 48  BILITOT 0.4  PROT 7.3  ALBUMIN 4.5   No results for input(s): LIPASE, AMYLASE in the last 168 hours. No results for input(s): AMMONIA in the last 168 hours. CBC:  Recent Labs Lab 11/13/14 2215 11/15/14 1115  WBC 11.7* 7.4  NEUTROABS 9.0* 3.9  HGB 12.7* 11.9*  HCT 36.7* 35.5*  MCV 85.5 87.4  PLT 294 318   Cardiac Enzymes:  Recent Labs Lab 11/14/14 0237  TROPONINI <0.03   BNP: BNP (last 3 results) No results for input(s): BNP in the last 8760 hours.  ProBNP (last 3 results) No results for input(s): PROBNP in the last 8760 hours.  CBG: No results for input(s): GLUCAP in the last 168  hours.     SignedKathlen Mody  [facility] 11/15/2014, 2:21 PM

## 2014-11-16 LAB — LEGIONELLA PNEUMOPHILA SEROGP 1 UR AG: L. pneumophila Serogp 1 Ur Ag: NEGATIVE

## 2014-11-19 LAB — CULTURE, BLOOD (ROUTINE X 2)
CULTURE: NO GROWTH
CULTURE: NO GROWTH

## 2015-07-23 IMAGING — CR DG ABDOMEN 1V
2 series · 2 of 2 positions shown · non-contrast
Comparison: CT 06/26/2014

CLINICAL DATA: Left side flank pain started this morning; hx left
urologist office 2 weeks ago for routine check up with no pain, and
was told that he had 2 left side kidney stones; pt states that his
urologist is planning doing another perc,

EXAM:
ABDOMEN - 1 VIEW

[t abdomen supine (1 of 2)]
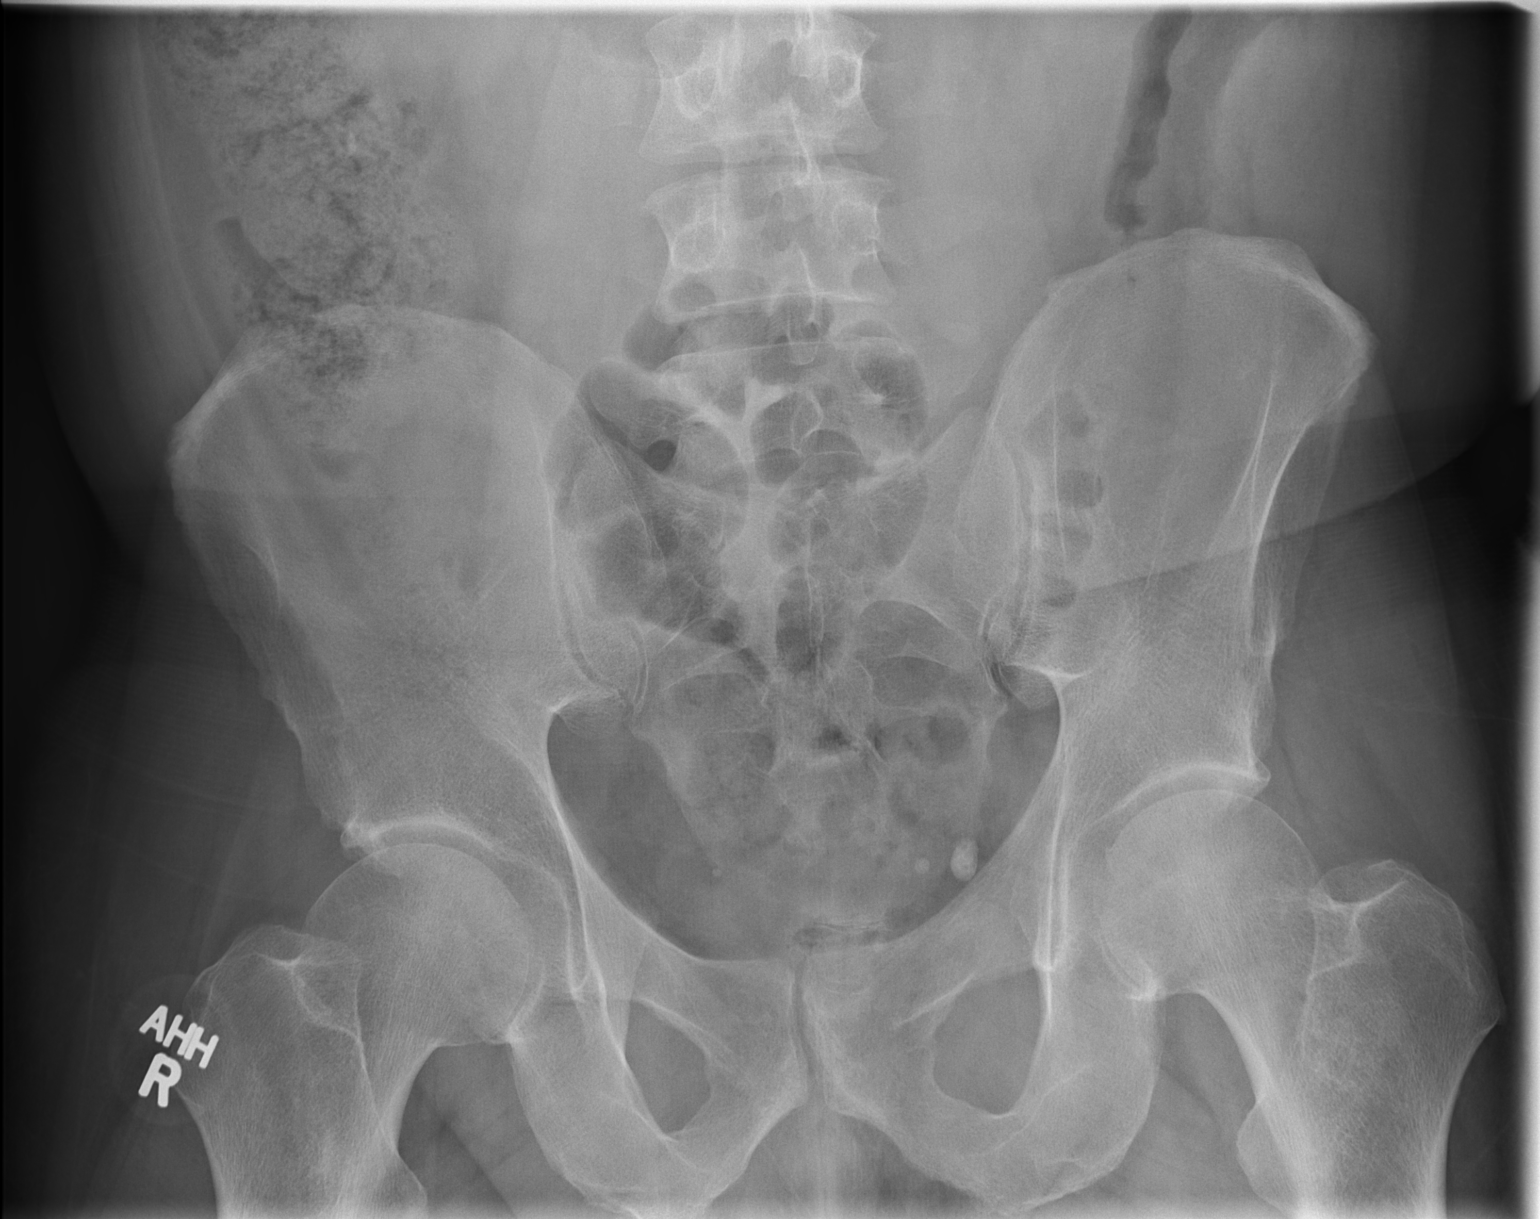

[t abdomen supine (2 of 2)]
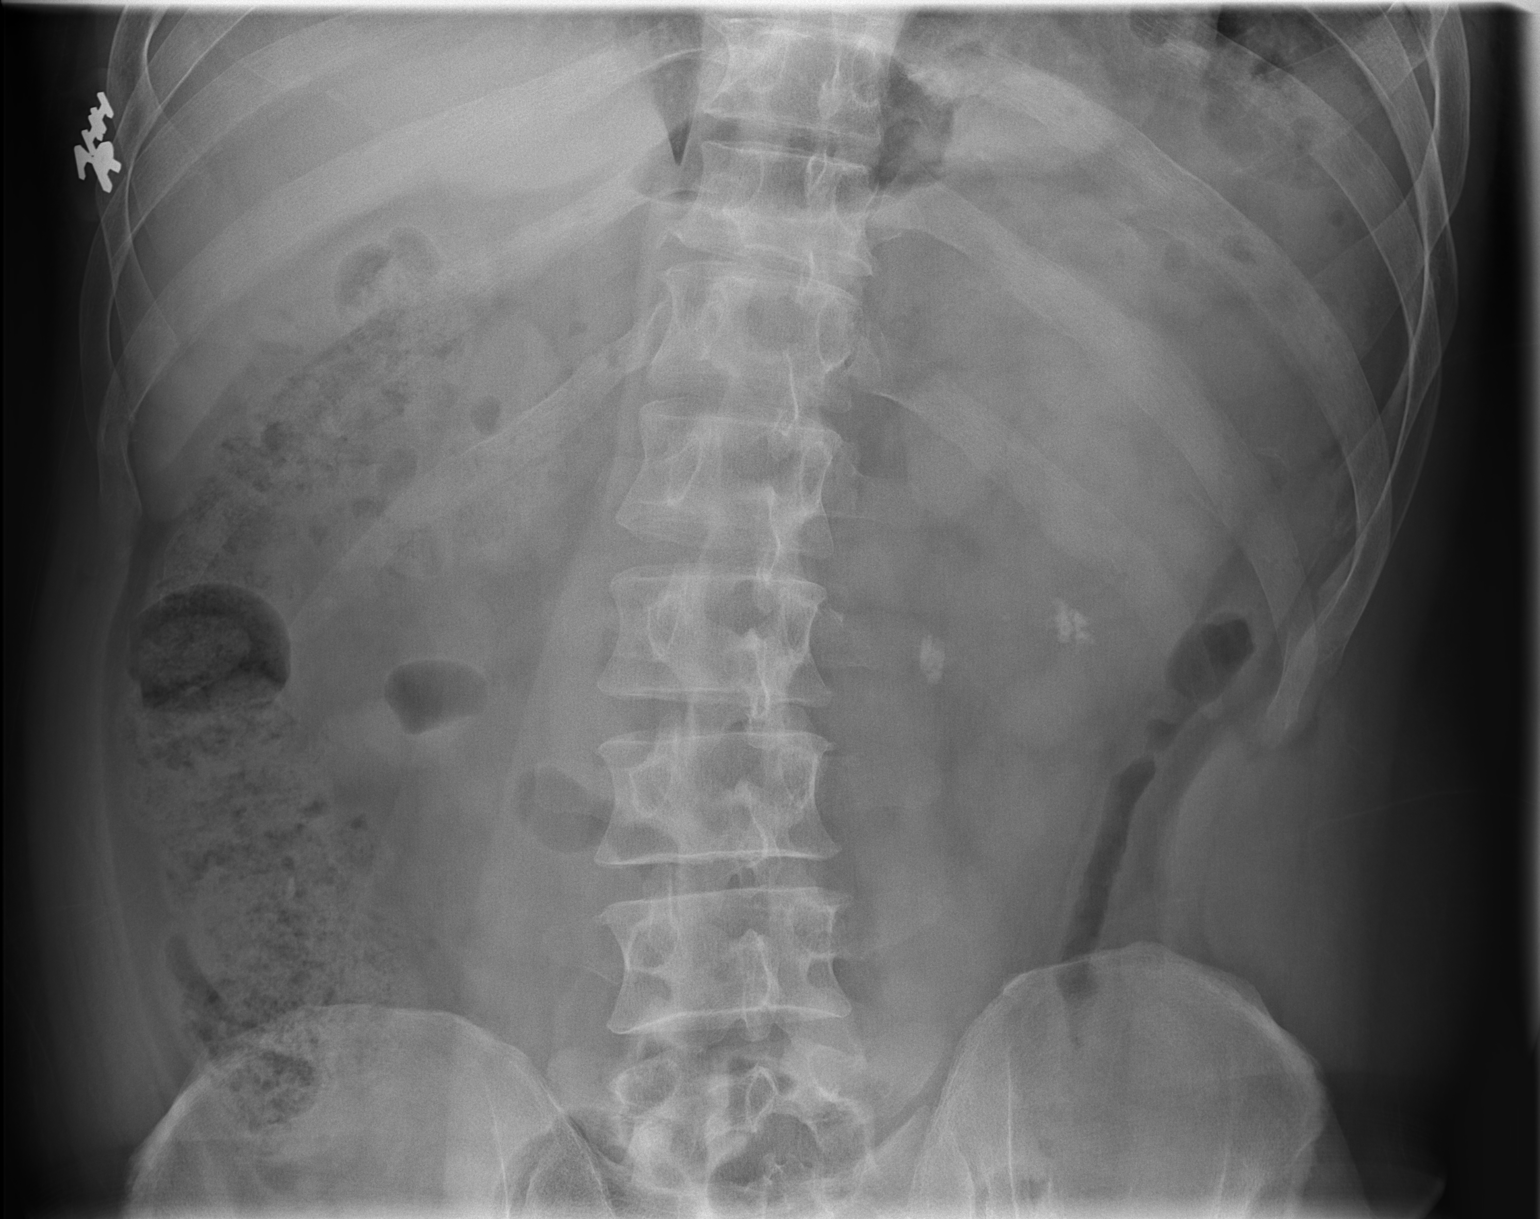

[2 of 2 positions shown; findings below may reference images not displayed]

FINDINGS: 13 mm calculus in the region of the left UPJ. 17 mm cluster of
calculi in the lower pole left renal collecting system. Normal bowel
gas pattern. Bilateral pelvic phleboliths.

Regional bones unremarkable.
IMPRESSION: 1. Stable left nephrolithiasis and UPJ calculus.
2. Normal bowel gas pattern.

## 2015-07-31 IMAGING — US US RENAL
1 series · 14 of 25 positions shown · non-contrast
Comparison: CT abdomen pelvis dated 06/26/2014

CLINICAL DATA: Left flank pain

EXAM:
RENAL / URINARY TRACT ULTRASOUND COMPLETE

[Series 1: us renal · 0.22mm/px · 14 of 46 slices shown]
[im 1/46]
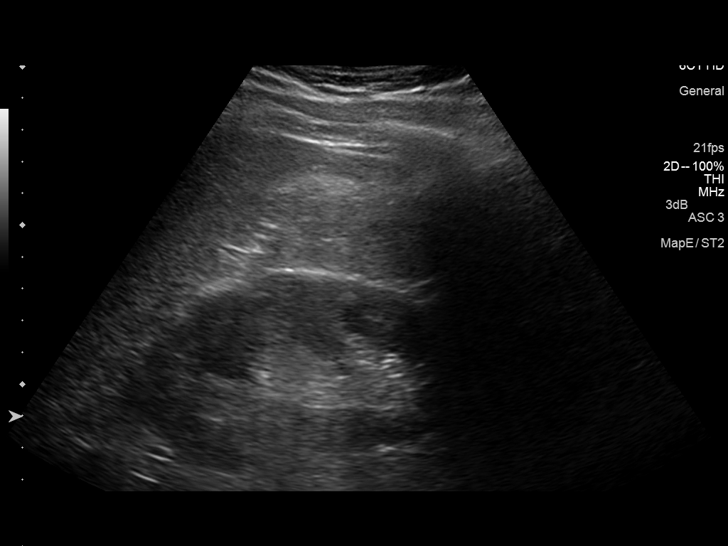
[im 4/46]
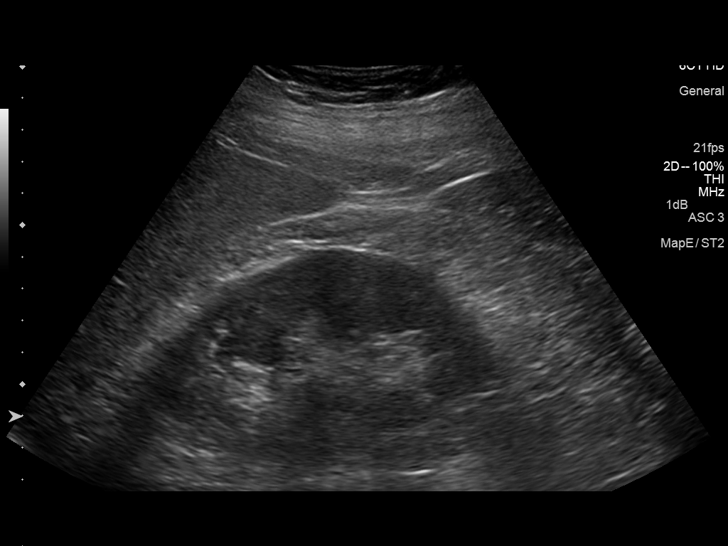
[im 8/46]
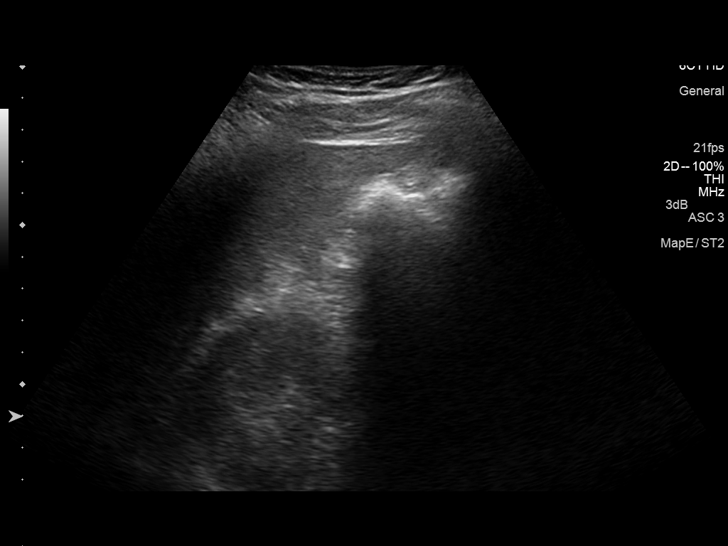
[im 12/46]
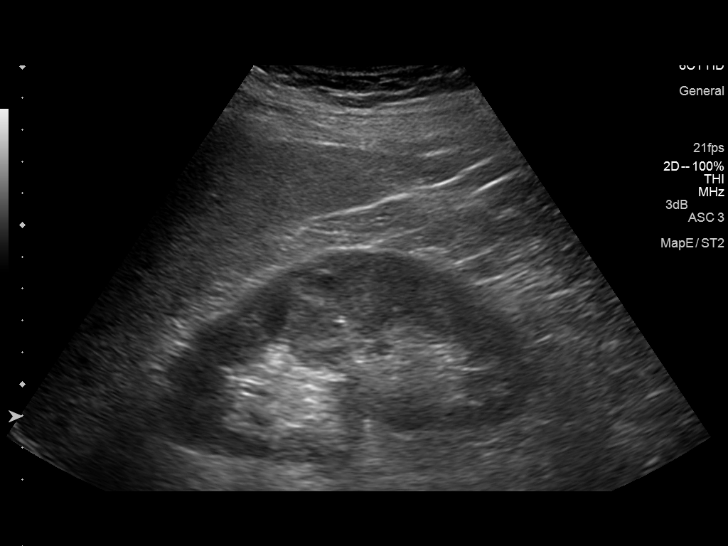
[im 16/46]
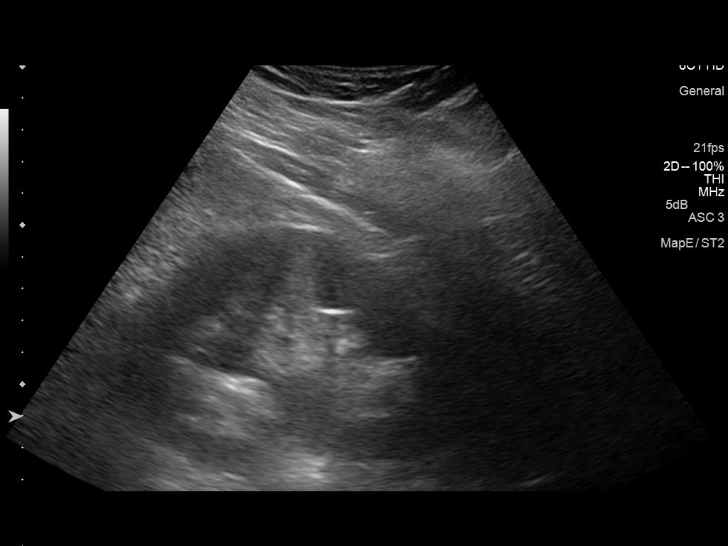
[im 17/46]
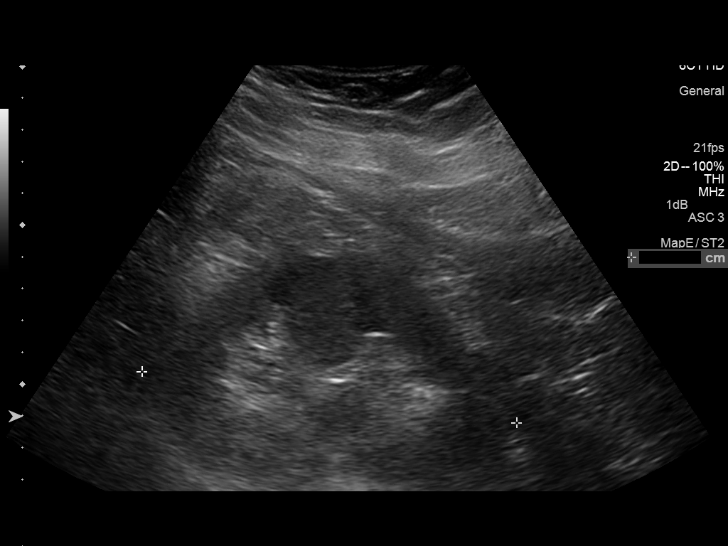
[im 21/46]
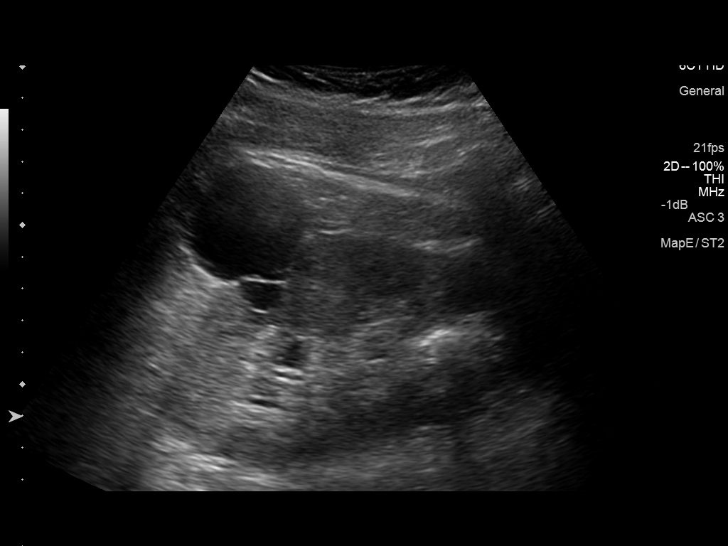
[im 25/46]
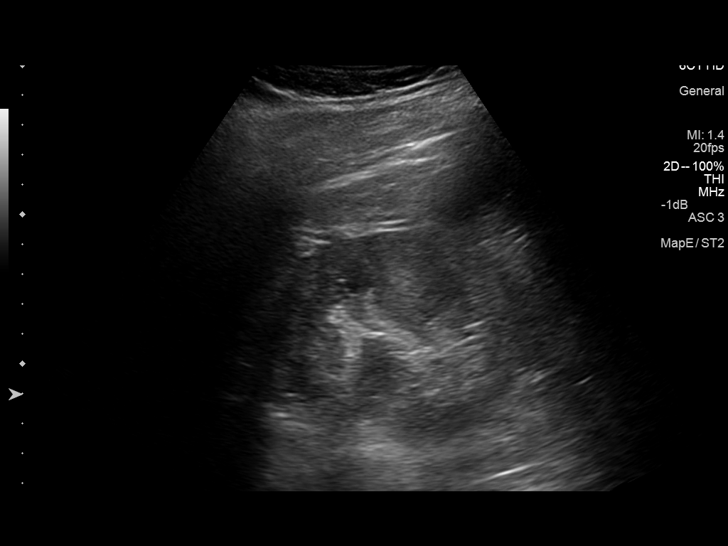
[im 29/46]
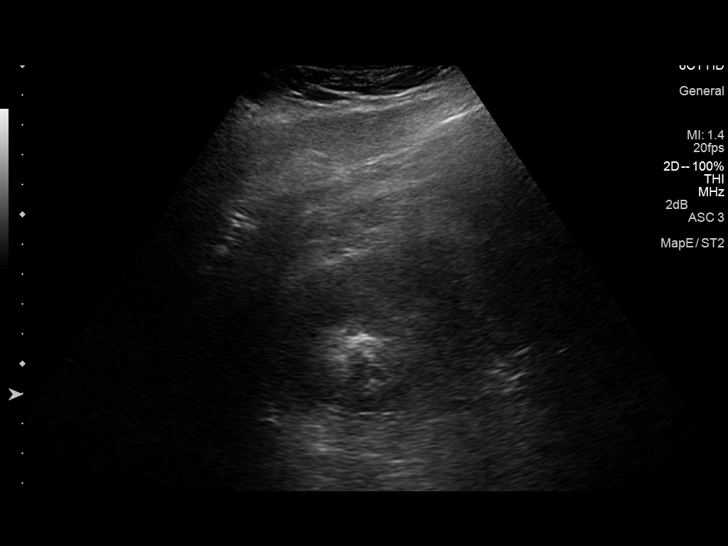
[im 31/46]
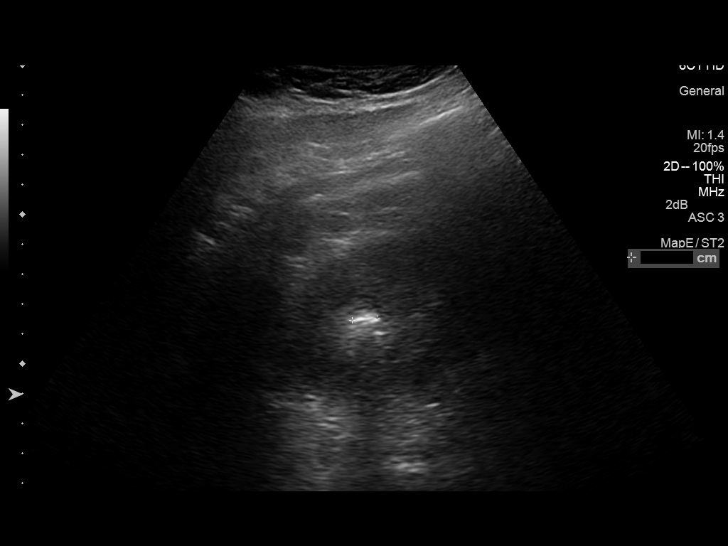
[im 34/46]
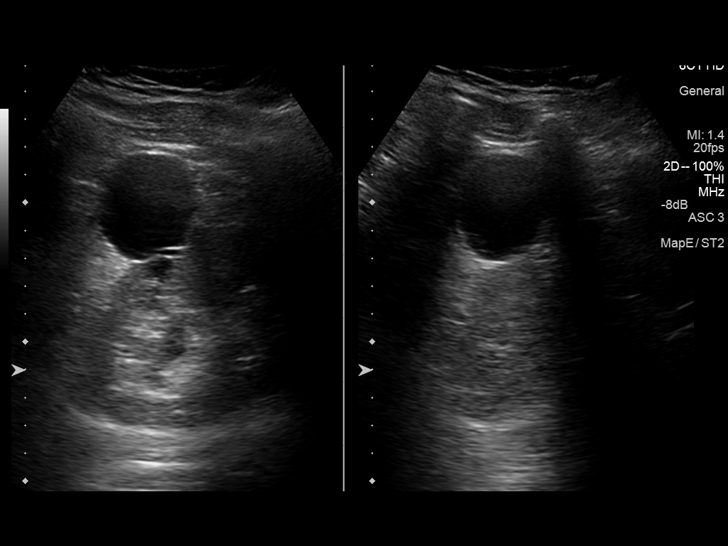
[im 38/46]
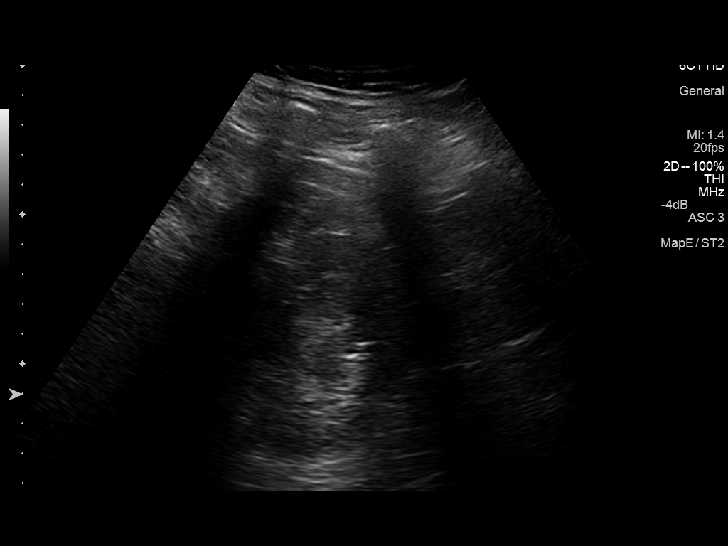
[im 42/46]
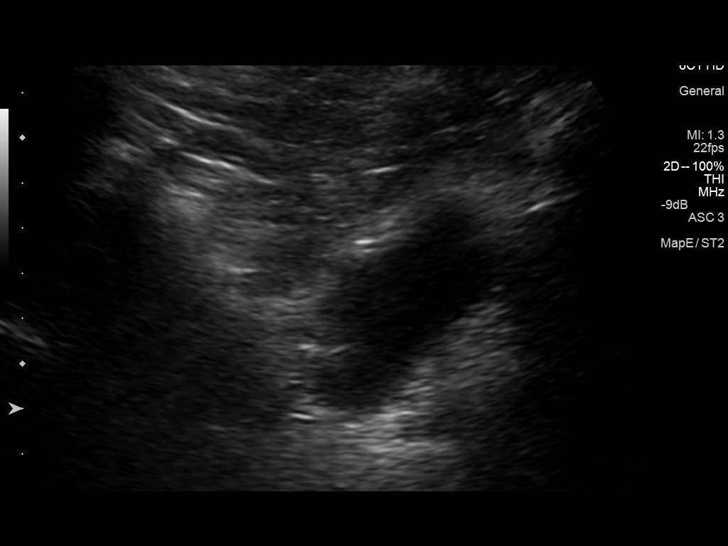
[im 46/46]
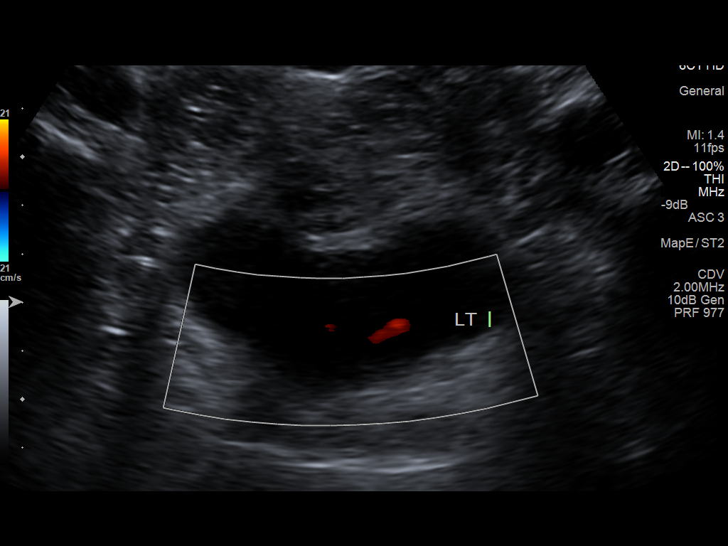

[14 of 25 positions shown; findings below may reference images not displayed]

FINDINGS: Right Kidney:

Length: 12.3 cm. 3 mm nonobstructing upper pole renal calculus. No
hydronephrosis.

Left Kidney:

Length: 11.9 cm. 3.8 x 3.9 x 3.6 cm upper pole renal cyst. 5 mm
nonobstructing interpolar calculus. 8 mm nonobstructing lower pole
renal calculus. Mild fullness of the renal pelvis without frank
hydronephrosis.

Bladder:

Within normal limits.  Bilateral bladder jets are visualized.
IMPRESSION: 3.9 cm left upper pole renal cyst.

Bilateral nonobstructing renal calculi measuring up to 8 mm on the
left.

Mild fullness of the left renal pelvis without frank hydronephrosis.

## 2015-11-22 IMAGING — DX DG CHEST 1V PORT
1 series · 1 of 1 positions shown · non-contrast
Comparison: November 07, 2014

CLINICAL DATA: Status post left nephrostomy tube removal. Shortness
of breath starting a few hours ago.

EXAM:
PORTABLE CHEST 1 VIEW

[chest ap]
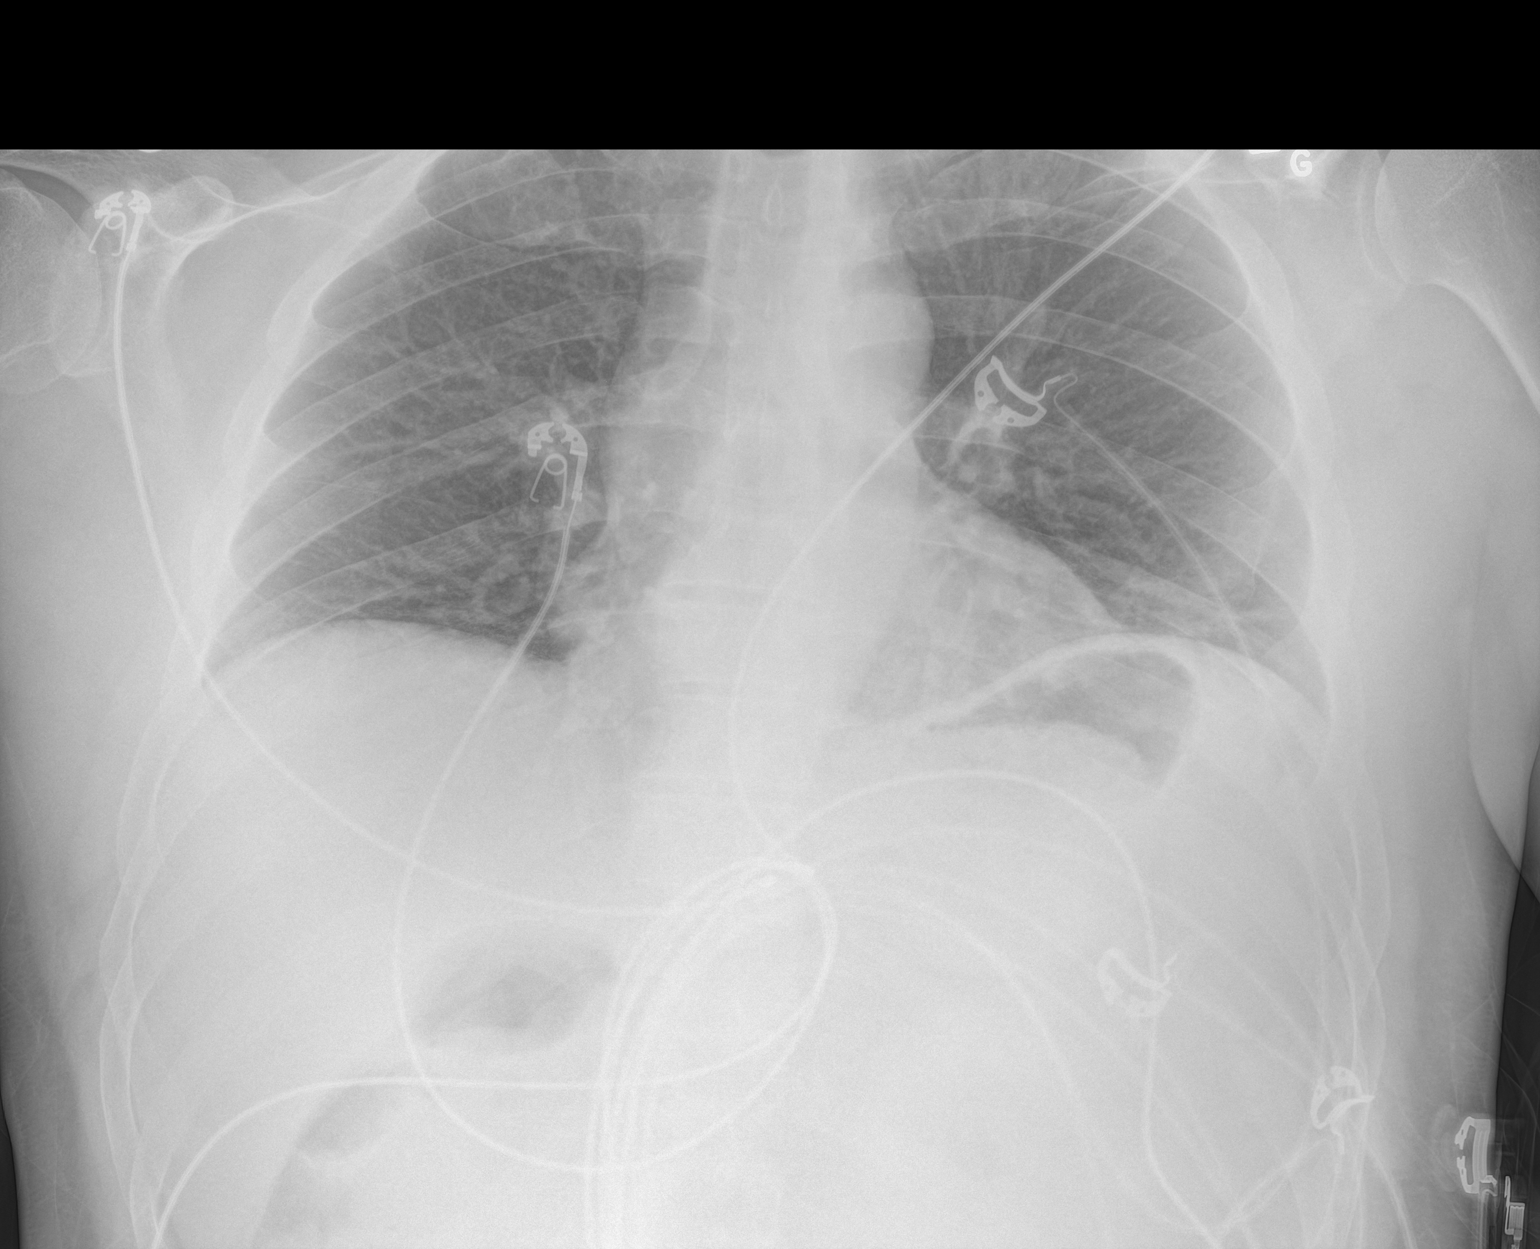

[1 of 1 positions shown; findings below may reference images not displayed]

FINDINGS: The heart size and mediastinal contours are within normal limits.
The aorta is tortuous. There is mild atelectasis of the left lung
base. There is no focal infiltrate, pulmonary edema, or pleural
effusion. The lung volumes are low. The visualized skeletal
structures are stable.
IMPRESSION: Mild atelectasis of left lung base.  No focal pneumonia.

## 2016-03-15 DIAGNOSIS — N200 Calculus of kidney: Secondary | ICD-10-CM | POA: Diagnosis not present
